# Patient Record
Sex: Male | Born: 1970 | Race: Black or African American | Hispanic: No | Marital: Married | Smoking: Never smoker
Health system: Southern US, Community
[De-identification: ages and names within clinical notes are randomized; demographics above are authoritative.]

## PROBLEM LIST (undated history)

## (undated) DIAGNOSIS — I10 Essential (primary) hypertension: Secondary | ICD-10-CM

## (undated) DIAGNOSIS — Z860100 Personal history of colon polyps, unspecified: Secondary | ICD-10-CM

## (undated) DIAGNOSIS — Z8619 Personal history of other infectious and parasitic diseases: Secondary | ICD-10-CM

## (undated) DIAGNOSIS — E785 Hyperlipidemia, unspecified: Secondary | ICD-10-CM

## (undated) DIAGNOSIS — I2510 Atherosclerotic heart disease of native coronary artery without angina pectoris: Secondary | ICD-10-CM

## (undated) DIAGNOSIS — T7840XA Allergy, unspecified, initial encounter: Secondary | ICD-10-CM

## (undated) HISTORY — PX: UMBILICAL HERNIA REPAIR: SHX196

## (undated) HISTORY — DX: Personal history of other infectious and parasitic diseases: Z86.19

## (undated) HISTORY — DX: Personal history of colonic polyps: Z86.010

## (undated) HISTORY — PX: SHOULDER SURGERY: SHX246

## (undated) HISTORY — PX: OTHER SURGICAL HISTORY: SHX169

## (undated) HISTORY — DX: Allergy, unspecified, initial encounter: T78.40XA

## (undated) HISTORY — DX: Atherosclerotic heart disease of native coronary artery without angina pectoris: I25.10

## (undated) HISTORY — DX: Personal history of colon polyps, unspecified: Z86.0100

## (undated) HISTORY — DX: Essential (primary) hypertension: I10

## (undated) HISTORY — DX: Hyperlipidemia, unspecified: E78.5

---
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
## 2001-09-09 ENCOUNTER — Encounter: Admission: RE | Admit: 2001-09-09 | Discharge: 2001-12-08 | Payer: Self-pay | Admitting: Family Medicine

## 2004-01-18 ENCOUNTER — Encounter: Admission: RE | Admit: 2004-01-18 | Discharge: 2004-01-18 | Payer: Self-pay | Admitting: Family Medicine

## 2004-01-26 ENCOUNTER — Ambulatory Visit (HOSPITAL_BASED_OUTPATIENT_CLINIC_OR_DEPARTMENT_OTHER): Admission: RE | Admit: 2004-01-26 | Discharge: 2004-01-26 | Payer: Self-pay | Admitting: *Deleted

## 2007-06-04 ENCOUNTER — Emergency Department (HOSPITAL_COMMUNITY): Admission: EM | Admit: 2007-06-04 | Discharge: 2007-06-05 | Payer: Self-pay | Admitting: Emergency Medicine

## 2009-05-06 ENCOUNTER — Emergency Department: Payer: Self-pay | Admitting: Emergency Medicine

## 2010-08-11 NOTE — Op Note (Signed)
NAMEPEYSON, POSTEMA               ACCOUNT NO.:  [id number]   MEDICAL RECORD NO.:  [id number]          PATIENT TYPE:  AMB   LOCATION:  [facility]                         FACILITY:  [facility]   PHYSICIAN:  Vikki Ports, MDDATE OF BIRTH:  Jul 16, 1970   DATE OF PROCEDURE:  01/26/2004  DATE OF DISCHARGE:  01/26/2004                                 OPERATIVE REPORT   PREOPERATIVE DIAGNOSIS:  Umbilical hernia.   POSTOPERATIVE DIAGNOSIS:  Umbilical hernia.   PROCEDURE:  Umbilical hernia repair with mesh.   SURGEON:  Vikki Ports, MD   ANESTHESIA:  General.   DESCRIPTION OF PROCEDURE:  Patient was taken to the operating room and  placed in a supine position.  After adequate general anesthesia using an  endotracheal tube, the abdomen was prepped and draped in a normal sterile  fashion.  A transverse supraumbilical incision was made, dissected down to  the small hernia sac, which was excised.  The fascial defect was identified  and measured about 1.5 to 2 cm.  It was closed with an interrupted 0  Surgilene.  A piece of Parietex mesh was placed over it and tacked in place  with the Surgilene.  Adequate hemostasis was insured, and the skin was  closed with subcuticular 4-0 Monocryl.  Steri-Strips and dry sterile  dressings were applied.  The patient tolerated the procedure well and went  to the PACU in good condition.     Gaylyn Rong   KRH/MEDQ  D:  01/28/2004  T:  01/28/2004  Job:  [id number]

## 2013-04-25 ENCOUNTER — Encounter: Payer: Self-pay | Admitting: *Deleted

## 2020-11-02 ENCOUNTER — Other Ambulatory Visit: Payer: Self-pay

## 2020-11-02 ENCOUNTER — Encounter: Payer: Self-pay | Admitting: Family Medicine

## 2020-11-02 ENCOUNTER — Ambulatory Visit (INDEPENDENT_AMBULATORY_CARE_PROVIDER_SITE_OTHER): Payer: 59 | Admitting: Family Medicine

## 2020-11-02 VITALS — BP 118/78 | HR 84 | Temp 97.6°F | Ht 69.0 in | Wt 209.0 lb

## 2020-11-02 DIAGNOSIS — Z6832 Body mass index (BMI) 32.0-32.9, adult: Secondary | ICD-10-CM | POA: Insufficient documentation

## 2020-11-02 DIAGNOSIS — H3500 Unspecified background retinopathy: Secondary | ICD-10-CM | POA: Diagnosis not present

## 2020-11-02 DIAGNOSIS — E7800 Pure hypercholesterolemia, unspecified: Secondary | ICD-10-CM | POA: Diagnosis not present

## 2020-11-02 DIAGNOSIS — I10 Essential (primary) hypertension: Secondary | ICD-10-CM

## 2020-11-02 DIAGNOSIS — Z6830 Body mass index (BMI) 30.0-30.9, adult: Secondary | ICD-10-CM

## 2020-11-02 DIAGNOSIS — R7303 Prediabetes: Secondary | ICD-10-CM | POA: Diagnosis not present

## 2020-11-02 DIAGNOSIS — E6609 Other obesity due to excess calories: Secondary | ICD-10-CM | POA: Insufficient documentation

## 2020-11-02 DIAGNOSIS — N183 Chronic kidney disease, stage 3 unspecified: Secondary | ICD-10-CM | POA: Diagnosis not present

## 2020-11-02 DIAGNOSIS — E785 Hyperlipidemia, unspecified: Secondary | ICD-10-CM | POA: Insufficient documentation

## 2020-11-02 DIAGNOSIS — E1169 Type 2 diabetes mellitus with other specified complication: Secondary | ICD-10-CM | POA: Insufficient documentation

## 2020-11-02 LAB — CBC WITH DIFFERENTIAL/PLATELET
Basophils Absolute: 0 10*3/uL (ref 0.0–0.1)
Basophils Relative: 0.9 % (ref 0.0–3.0)
Eosinophils Absolute: 0.1 10*3/uL (ref 0.0–0.7)
Eosinophils Relative: 1.3 % (ref 0.0–5.0)
HCT: 48.7 % (ref 39.0–52.0)
Hemoglobin: 16.4 g/dL (ref 13.0–17.0)
Lymphocytes Relative: 48.4 % — ABNORMAL HIGH (ref 12.0–46.0)
Lymphs Abs: 2 10*3/uL (ref 0.7–4.0)
MCHC: 33.6 g/dL (ref 30.0–36.0)
MCV: 86.7 fl (ref 78.0–100.0)
Monocytes Absolute: 0.2 10*3/uL (ref 0.1–1.0)
Monocytes Relative: 5.6 % (ref 3.0–12.0)
Neutro Abs: 1.8 10*3/uL (ref 1.4–7.7)
Neutrophils Relative %: 43.8 % (ref 43.0–77.0)
Platelets: 243 10*3/uL (ref 150.0–400.0)
RBC: 5.62 Mil/uL (ref 4.22–5.81)
RDW: 13.8 % (ref 11.5–15.5)
WBC: 4.1 10*3/uL (ref 4.0–10.5)

## 2020-11-02 LAB — COMPREHENSIVE METABOLIC PANEL
ALT: 25 U/L (ref 0–53)
AST: 24 U/L (ref 0–37)
Albumin: 4.7 g/dL (ref 3.5–5.2)
Alkaline Phosphatase: 45 U/L (ref 39–117)
BUN: 15 mg/dL (ref 6–23)
CO2: 28 mEq/L (ref 19–32)
Calcium: 10 mg/dL (ref 8.4–10.5)
Chloride: 102 mEq/L (ref 96–112)
Creatinine, Ser: 1.23 mg/dL (ref 0.40–1.50)
GFR: 68.78 mL/min (ref >60.00)
Glucose, Bld: 105 mg/dL — ABNORMAL HIGH (ref 70–99)
Potassium: 4.2 mEq/L (ref 3.5–5.1)
Sodium: 139 mEq/L (ref 135–145)
Total Bilirubin: 0.9 mg/dL (ref 0.2–1.2)
Total Protein: 7.5 g/dL (ref 6.0–8.3)

## 2020-11-02 LAB — LIPID PANEL
Cholesterol: 209 mg/dL — ABNORMAL HIGH (ref 0–200)
HDL: 43.9 mg/dL (ref >39.00)
NonHDL: 164.69
Total CHOL/HDL Ratio: 5
Triglycerides: 205 mg/dL — ABNORMAL HIGH (ref 0.0–149.0)
VLDL: 41 mg/dL — ABNORMAL HIGH (ref 0.0–40.0)

## 2020-11-02 LAB — LDL CHOLESTEROL, DIRECT: Direct LDL: 141 mg/dL

## 2020-11-02 LAB — HEMOGLOBIN A1C: Hgb A1c MFr Bld: 6 % (ref 4.6–6.5)

## 2020-11-02 LAB — TSH: TSH: 1.73 u[IU]/mL (ref 0.35–5.50)

## 2020-11-02 NOTE — Assessment & Plan Note (Signed)
In setting of HTN and hyperlipidemia and ckd and prediabetes Discussed how this problem influences overall health and the risks it imposes  Reviewed plan for weight loss with lower calorie diet (via better food choices and also portion control or program like weight watchers) and exercise building up to or more than 30 minutes 5 days per week including some aerobic activity   Pt is motivated to return to better habits

## 2020-11-02 NOTE — Assessment & Plan Note (Signed)
Per pt highest a1c in the past was 6.4  Then went down to 5.7 with diet/exercise and wt loss  Since then, in process of move gained wt back and went back to poor eating  Labs ordered today  Expect inc in a1c Discussed return to low glycemic diet and exercise when able to

## 2020-11-02 NOTE — Assessment & Plan Note (Signed)
Recently moved here from [facility] and will need a new nephrologist  Last GFR 47 and Cr 1.69  H/o HTN and hyperlipidemia  Pt thinks a prior medication may have caused problems  Reviewed some records in care everywhere from [facility]  Ref made to local nephrologist  Labs ordered

## 2020-11-02 NOTE — Patient Instructions (Addendum)
Try to get most of your carbohydrates from produce (with the exception of white potatoes)  Eat less bread/pasta/rice/snack foods/cereals/sweets and other items from the middle of the grocery store (processed carbs)  Decide what you want to do for exercise   Labs today   I will place a referral for ophthalmology and nephrology

## 2020-11-02 NOTE — Assessment & Plan Note (Signed)
bp in fair control at this time  In the setting of CKD BP Readings from Last 1 Encounters:  11/02/20 118/78   No changes needed Most recent labs reviewed  Disc lifstyle change with low sodium diet and exercise  Plan to continue losartan 50 mg daily  Making plan to refer to nephrology  Labs ordered today

## 2020-11-02 NOTE — Assessment & Plan Note (Signed)
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

## 2020-11-02 NOTE — Assessment & Plan Note (Signed)
Pt takes crestor 20 mg daily (did not tolerate 40, had muscle pain) Tolerates this well  Diet is fair Enc low sat/trans fat diet if possible  Labs ordered today
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
## 2020-11-06 ENCOUNTER — Telehealth: Payer: Self-pay | Admitting: Family Medicine

## 2020-11-06 DIAGNOSIS — E7800 Pure hypercholesterolemia, unspecified: Secondary | ICD-10-CM

## 2020-11-06 MED ORDER — EZETIMIBE 10 MG PO TABS: 10.0000 mg | ORAL_TABLET | Freq: Every day | ORAL | 3 refills | Status: DC

## 2020-11-06 NOTE — Telephone Encounter (Signed)
-----   Message from Shon Millet, [place] sent at 11/04/2020 12:55 PM EDT ----- Pt notified of lab results and Dr. Royden Purl comments. Pt is okay trying Zetia uses [facility]: [address]

## 2020-11-06 NOTE — Telephone Encounter (Signed)
I sent it  Take the zetia daily along with the crestor  Please check fasting lipids in about 6 weeks

## 2021-02-02 ENCOUNTER — Other Ambulatory Visit (HOSPITAL_BASED_OUTPATIENT_CLINIC_OR_DEPARTMENT_OTHER): Payer: Self-pay | Admitting: Nephrology

## 2021-02-02 ENCOUNTER — Other Ambulatory Visit (HOSPITAL_COMMUNITY): Payer: Self-pay | Admitting: Nephrology

## 2021-02-02 DIAGNOSIS — R8290 Unspecified abnormal findings in urine: Secondary | ICD-10-CM

## 2021-02-02 DIAGNOSIS — I10 Essential (primary) hypertension: Secondary | ICD-10-CM

## 2021-02-02 DIAGNOSIS — N1831 Chronic kidney disease, stage 3a: Secondary | ICD-10-CM

## 2021-02-03 ENCOUNTER — Other Ambulatory Visit: Payer: Self-pay

## 2021-02-03 ENCOUNTER — Ambulatory Visit (INDEPENDENT_AMBULATORY_CARE_PROVIDER_SITE_OTHER): Payer: 59

## 2021-02-03 DIAGNOSIS — Z23 Encounter for immunization: Secondary | ICD-10-CM | POA: Diagnosis not present

## 2021-02-10 ENCOUNTER — Ambulatory Visit: Payer: 59

## 2021-03-09 ENCOUNTER — Other Ambulatory Visit: Payer: Self-pay | Admitting: Nephrology

## 2021-03-09 DIAGNOSIS — N1831 Chronic kidney disease, stage 3a: Secondary | ICD-10-CM

## 2021-03-09 DIAGNOSIS — I10 Essential (primary) hypertension: Secondary | ICD-10-CM

## 2021-03-09 DIAGNOSIS — R8290 Unspecified abnormal findings in urine: Secondary | ICD-10-CM

## 2021-04-11 ENCOUNTER — Ambulatory Visit: Payer: Self-pay | Admitting: Family Medicine

## 2021-04-20 ENCOUNTER — Telehealth: Payer: Self-pay | Admitting: Family Medicine

## 2021-04-20 NOTE — Telephone Encounter (Signed)
Pt called in want's to discuss a bill for a family member that need to be resolve would like a call back #[phone]

## 2021-05-17 ENCOUNTER — Ambulatory Visit (INDEPENDENT_AMBULATORY_CARE_PROVIDER_SITE_OTHER): Payer: 59 | Admitting: Urology

## 2021-05-17 ENCOUNTER — Other Ambulatory Visit: Payer: Self-pay

## 2021-05-17 ENCOUNTER — Encounter: Payer: Self-pay | Admitting: Urology

## 2021-05-17 VITALS — BP 139/93 | HR 80 | Ht 69.0 in | Wt 210.0 lb

## 2021-05-17 DIAGNOSIS — E349 Endocrine disorder, unspecified: Secondary | ICD-10-CM

## 2021-05-17 DIAGNOSIS — N529 Male erectile dysfunction, unspecified: Secondary | ICD-10-CM

## 2021-05-17 DIAGNOSIS — N5082 Scrotal pain: Secondary | ICD-10-CM

## 2021-05-17 LAB — URINALYSIS, COMPLETE
Bilirubin, UA: NEGATIVE
Glucose, UA: NEGATIVE
Ketones, UA: NEGATIVE
Leukocytes,UA: NEGATIVE
Nitrite, UA: NEGATIVE
Protein,UA: NEGATIVE
Specific Gravity, UA: 1.02 (ref 1.005–1.030)
Urobilinogen, Ur: 0.2 mg/dL (ref 0.2–1.0)
pH, UA: 5.5 (ref 5.0–7.5)

## 2021-05-17 LAB — MICROSCOPIC EXAMINATION
Bacteria, UA: NONE SEEN
Epithelial Cells (non renal): NONE SEEN /hpf (ref 0–10)
WBC, UA: NONE SEEN /hpf (ref 0–5)

## 2021-05-17 MED ORDER — TADALAFIL 20 MG PO TABS: ORAL_TABLET | ORAL | 0 refills | Status: DC
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
## 2021-05-18 ENCOUNTER — Encounter: Payer: Self-pay | Admitting: Urology

## 2021-05-18 LAB — TESTOSTERONE: Testosterone: 193 ng/dL — ABNORMAL LOW (ref 264–916)

## 2021-05-18 LAB — LUTEINIZING HORMONE: LH: 3.3 m[IU]/mL (ref 1.7–8.6)

## 2021-05-19 ENCOUNTER — Ambulatory Visit
Admission: RE | Admit: 2021-05-19 | Discharge: 2021-05-19 | Disposition: A | Discharge status: Home / Self Care | Payer: 59 | Source: Ambulatory Visit | Attending: Urology | Admitting: Urology

## 2021-05-19 ENCOUNTER — Telehealth: Payer: Self-pay | Admitting: Urology

## 2021-05-19 ENCOUNTER — Other Ambulatory Visit: Payer: Self-pay

## 2021-05-19 ENCOUNTER — Other Ambulatory Visit: Payer: Self-pay | Admitting: Urology

## 2021-05-19 ENCOUNTER — Encounter: Payer: Self-pay | Admitting: *Deleted

## 2021-05-19 DIAGNOSIS — N5082 Scrotal pain: Secondary | ICD-10-CM | POA: Insufficient documentation

## 2021-05-19 MED ORDER — CLOMIPHENE CITRATE 50 MG PO TABS: 25.0000 mg | ORAL_TABLET | Freq: Every day | ORAL | 0 refills | Status: DC

## 2021-05-19 NOTE — Telephone Encounter (Signed)
Testosterone level low at 193.  LH level was low normal.  Could restart on testosterone however he would need a repeat a.m. testosterone level.  He would also be a good candidate for oral Clomid which will stimulate his testicles to produce more testosterone.  This is an off label use and would not be covered by insurance.  The cost would be ~ $50 per month

## 2021-05-22 ENCOUNTER — Other Ambulatory Visit: Payer: Self-pay | Admitting: *Deleted

## 2021-05-22 ENCOUNTER — Telehealth: Payer: Self-pay

## 2021-05-22 ENCOUNTER — Other Ambulatory Visit: Payer: 59

## 2021-05-22 ENCOUNTER — Other Ambulatory Visit: Payer: Self-pay | Admitting: Nephrology

## 2021-05-22 ENCOUNTER — Other Ambulatory Visit: Payer: Self-pay

## 2021-05-22 DIAGNOSIS — N1831 Chronic kidney disease, stage 3a: Secondary | ICD-10-CM

## 2021-05-22 DIAGNOSIS — E349 Endocrine disorder, unspecified: Secondary | ICD-10-CM

## 2021-05-22 DIAGNOSIS — E785 Hyperlipidemia, unspecified: Secondary | ICD-10-CM

## 2021-05-22 MED ORDER — LOSARTAN POTASSIUM 50 MG PO TABS: 50.0000 mg | ORAL_TABLET | Freq: Every day | ORAL | 1 refills | Status: DC

## 2021-05-23 ENCOUNTER — Encounter: Payer: Self-pay | Admitting: *Deleted

## 2021-05-23 LAB — TESTOSTERONE: Testosterone: 263 ng/dL — ABNORMAL LOW (ref 264–916)

## 2021-05-25 ENCOUNTER — Ambulatory Visit: Payer: Self-pay | Admitting: Urology

## 2021-07-07 ENCOUNTER — Other Ambulatory Visit: Payer: Self-pay

## 2021-07-07 DIAGNOSIS — E349 Endocrine disorder, unspecified: Secondary | ICD-10-CM

## 2021-07-10 ENCOUNTER — Other Ambulatory Visit: Payer: 59

## 2021-07-10 ENCOUNTER — Telehealth: Payer: Self-pay

## 2021-07-10 DIAGNOSIS — E349 Endocrine disorder, unspecified: Secondary | ICD-10-CM

## 2021-07-10 NOTE — Telephone Encounter (Signed)
Pt LM on triage line requesting call back.  ? ?Called pt he states that he has been taking Cialis 20mg  for erections but it is not effective, he questions what his other options are.  ? ?Secondly, pt states that his insurance does cover clomid, it is only a $10 co-pay for him, however he has only noticed minimal improvement of symptoms on clomid and wonders if his dose can be increased pending his labs from today. Please advise.  ?

## 2021-07-11 LAB — TESTOSTERONE: Testosterone: 512 ng/dL (ref 264–916)

## 2021-07-12 NOTE — Telephone Encounter (Signed)
Patient is wanting to know if he can have something stronger than the 20mg  Cialis. He is also wanting to know if he can have Clomid at a higher dose? He said his desire is a little better but not improved to satisfaction.  ?

## 2021-07-13 NOTE — Telephone Encounter (Signed)
Testosterone level was 512 so increasing the Clomid will not improve thanks.  Some patients have good testosterone levels on Clomid but do not see symptom improvement.  His best option would be switching to testosterone injections or gel.  ? ?He can try sildenafil 100 mg for ED.  Can send in 10 tabs for him to try if he has not tried before ?

## 2021-07-14 MED ORDER — SILDENAFIL CITRATE 100 MG PO TABS: 100.0000 mg | ORAL_TABLET | Freq: Every day | ORAL | 0 refills | Status: DC | PRN

## 2021-07-14 NOTE — Telephone Encounter (Signed)
Spoke to patient and he is going to continue on the Clomid. A new rx for Silenafil was sent to the pharmacy. He will try this medication and see if he does better. He will contact our office if he decides to change testosterone therapy.  ?

## 2021-07-17 ENCOUNTER — Other Ambulatory Visit: Payer: Self-pay | Admitting: Family Medicine

## 2021-07-17 MED ORDER — CLOMIPHENE CITRATE 50 MG PO TABS: 25.0000 mg | ORAL_TABLET | Freq: Every day | ORAL | 0 refills | Status: DC

## 2021-07-25 ENCOUNTER — Other Ambulatory Visit: Payer: Self-pay | Admitting: Family Medicine

## 2021-09-17 ENCOUNTER — Other Ambulatory Visit: Payer: Self-pay | Admitting: Urology

## 2021-09-18 ENCOUNTER — Telehealth: Payer: Self-pay | Admitting: Family Medicine

## 2021-09-18 ENCOUNTER — Other Ambulatory Visit: Payer: Self-pay

## 2021-09-18 MED ORDER — LOSARTAN POTASSIUM 50 MG PO TABS: 50.0000 mg | ORAL_TABLET | Freq: Every day | ORAL | 0 refills | Status: DC

## 2021-09-18 NOTE — Telephone Encounter (Signed)
Sent refill

## 2021-09-27 ENCOUNTER — Telehealth: Payer: Self-pay

## 2021-09-27 NOTE — Telephone Encounter (Signed)
[facility] - Client TELEPHONE ADVICE RECORD AccessNurse Patient Name: Bruce Murphy Gender: Male DOB: Jul 12, 1970 Age: 51 Y 8 M 29 D Return Phone Number: [phone] (Primary) Address: City/ State/ Zip: [place] [address] Client [facility] - Client Client Site [facility] - Day Provider [place], Idamae Schuller - MD Contact Type Call Who Is Calling Patient / Member / Family / Caregiver Call Type Triage / Clinical Caller Name Danielle from office Relationship To Patient Other Return Phone Number [phone] (Primary) Chief Complaint CHEST PAIN - pain, pressure, heaviness or tightness Reason for Call Symptomatic / Request for Health Information Initial Comment Caller states pt has random attacks of chest pain and shortness of breath. Translation No Nurse Assessment Nurse: [place], RN, Marchelle Folks Date/Time (Eastern Time): 09/27/2021 9:22:26 AM Confirm and document reason for call. If symptomatic, describe symptoms. ---caller states he has chest pain and shortness of breath several times per day. going on for months. getting worse. Does the patient have any new or worsening symptoms? ---Yes Will a triage be completed? ---Yes Related visit to physician within the last 2 weeks? ---No Does the PT have any chronic conditions? (i.e. diabetes, asthma, this includes High risk factors for pregnancy, etc.) ---Yes List chronic conditions. ---HTN, Is this a behavioral health or substance abuse call? ---No Guidelines Guideline Title Affirmed Question Affirmed Notes Nurse Date/Time Lamount Cohen Time) Chest Pain [1] Chest pain lasts > 5 minutes AND [2] age > 46 Humfleet, RN, Marchelle Folks 09/27/2021 9:23:16 AM Disp. Time Lamount Cohen Time) Disposition Final User 09/27/2021 9:20:35 AM Send to Urgent Queue Lynnell Grain 09/27/2021 9:27:20 AM Call EMS 911 Now Yes Humfleet, RN, Marchelle Folks PLEASE NOTE: All timestamps contained within  this report are represented as [place] Standard Time. CONFIDENTIALTY NOTICE: This fax transmission is intended only for the addressee. It contains information that is legally privileged, confidential or otherwise protected from use or disclosure. If you are not the intended recipient, you are strictly prohibited from reviewing, disclosing, copying using or disseminating any of this information or taking any action in reliance on or regarding this information. If you have received this fax in error, please notify us immediately by telephone so that we can arrange for its return to [place]. Phone: [phone], Toll-Free: [phone], Fax: [phone] Page: 2 of 2 Call Id: [id number] Disp. Time Lamount Cohen Time) Disposition Final User 09/27/2021 9:34:10 AM 911 Outcome Documentation Humfleet, RN, Marchelle Folks Reason: driving to [place] Final Disposition 09/27/2021 9:27:20 AM Call EMS 911 Now Yes Humfleet, RN, Earnestine Leys Disagree/Comply Comply Caller Understands Yes PreDisposition InappropriateToAsk Care Advice Given Per Guideline CARE ADVICE given per Chest Pain (Adult) guideline. CALL EMS 911 NOW: * Immediate medical attention is needed. You need to hang up and call 911 (or an ambulance). Comments User: Jaclynn Major, RN Date/Time Lamount Cohen Time): 09/27/2021 9:28:29 AM patient states he is in the car now said but will call 91

## 2021-09-27 NOTE — Telephone Encounter (Signed)
I spoke with pt and he is presently at [facility] and is about to be triaged and if needed he will go to ED. Sending note to Dr Milinda Antis as Lorain Childes.

## 2021-09-27 NOTE — Telephone Encounter (Signed)
Aware, will watch for correspondence  

## 2021-09-28 ENCOUNTER — Ambulatory Visit (INDEPENDENT_AMBULATORY_CARE_PROVIDER_SITE_OTHER): Payer: No Typology Code available for payment source | Admitting: Nurse Practitioner

## 2021-09-28 VITALS — BP 142/78 | HR 85 | Temp 98.7°F | Resp 16 | Ht 69.0 in | Wt 214.0 lb

## 2021-09-28 DIAGNOSIS — R0789 Other chest pain: Secondary | ICD-10-CM | POA: Insufficient documentation

## 2021-09-28 DIAGNOSIS — R0689 Other abnormalities of breathing: Secondary | ICD-10-CM | POA: Diagnosis not present

## 2021-09-28 NOTE — Progress Notes (Signed)
Established Patient Office Visit  Subjective   Patient ID: Bruce Murphy, male    DOB: May 28, 1970  Age: 51 y.o. MRN: [id number]  Chief Complaint  Patient presents with   Follow-up    X few months gasping for air. Has happened 6 times this am.    HPI  Patient was seen in the emergency department on 09/27/2021.  Looks like he went to an urgent care earlier doctor's office and was told to go to the emergency department.  To be evaluated for chest pain has been present for months.  He was evaluated and discharged they did do basic blood work inclusive of CBC, CMP, troponin, BNP and CTA of chest that were all negative. Patient presents to clinic today for follow-up visit  States he was given levisin in the emergency department that he has not started taking it.  Patient denies history of reflux or current reflux symptoms  States that he has episodes like sleep apnea but during the day. States they do not correlate with anything states is not before nor after he eats.  States is not with any specific activity.  Says it is comparable to kind of with a hiccup would be much more intense.  States he did have an episode yesterday when it happened his chest did bother him for some time afterwards, like a soreness pain.   Review of Systems  Constitutional:  Negative for chills and fever.  Respiratory:  Negative for cough and shortness of breath.   Cardiovascular:  Positive for chest pain (MSK).  Gastrointestinal:  Negative for abdominal pain, heartburn, nausea and vomiting.      Objective:     BP (!) 142/78   Pulse 85   Temp 98.7 F (37.1 C)   Resp 16   Ht 5\' 9"  (1.753 m)   Wt 214 lb (97.1 kg)   SpO2 98%   BMI 31.60 kg/m    Physical Exam Vitals and nursing note reviewed.  Constitutional:      Appearance: Normal appearance.  Cardiovascular:     Rate and Rhythm: Normal rate and regular rhythm.     Heart sounds: Normal heart sounds.  Pulmonary:     Effort: Pulmonary effort is  normal.     Breath sounds: Normal breath sounds.  Abdominal:     General: Bowel sounds are normal. There is no distension.     Palpations: There is no mass.     Tenderness: There is no abdominal tenderness.     Hernia: No hernia is present.  Neurological:     Mental Status: He is alert.      No results found for any visits on 09/28/21.    The 10-year ASCVD risk score (Arnett DK, et al., 2019) is: 11.1%    Assessment & Plan:   Problem List Items Addressed This Visit       Other   Atypical chest pain - Primary    Patient was recently seen in the emergency department with labs performed along with a CT angio chest are all came back negative.  He was prescribed Levsin and has not started yet.  Encourage patient to try Levsin report back if this helps for possible esophageal spasms      Gasping for breath    On presentation per patient report.  He is described as waking gasping for air and likely sleep apnea episode.  Also correlates it to somewhat like a hiccup but much more intense.  We will start with  him trying Levsin to see if this helps for possible esophageal spasm.  If not we can discuss further and maybe trying a muscle relaxant for medications used to help cease hiccups.  Also another option would be maybe starting magnesium to help with some smooth muscle disorder movement in the chest.  Patient will keep [place] updated       Return if symptoms worsen or fail to improve.    Audria Nine, NP

## 2021-09-28 NOTE — Assessment & Plan Note (Signed)
On presentation per patient report.  He is described as waking gasping for air and likely sleep apnea episode.  Also correlates it to somewhat like a hiccup but much more intense.  We will start with him trying Levsin to see if this helps for possible esophageal spasm.  If not we can discuss further and maybe trying a muscle relaxant for medications used to help cease hiccups.  Also another option would be maybe starting magnesium to help with some smooth muscle disorder movement in the chest.  Patient will keep [place] updated

## 2021-09-28 NOTE — Assessment & Plan Note (Signed)
Patient was recently seen in the emergency department with labs performed along with a CT angio chest are all came back negative.  He was prescribed Levsin and has not started yet.  Encourage patient to try Levsin report back if this helps for possible esophageal spasms

## 2021-10-16 ENCOUNTER — Telehealth: Payer: Self-pay | Admitting: Family Medicine

## 2021-10-16 DIAGNOSIS — K2289 Other specified disease of esophagus: Secondary | ICD-10-CM

## 2021-10-16 NOTE — Telephone Encounter (Signed)
Spoke to patient by telephone and was advised that he has taken the Levsin which has helped some but is still having the episodes. Patient stated that he is not having any heartburn or indigestion. Patient stated that he is not taking anything for acid reflux. Patient stated that this has been going on for several months.

## 2021-10-16 NOTE — Telephone Encounter (Signed)
Spoke to patient by telephone and was advised that he was in recently to see Audria Nine NP. Patient stated that he has been having attacks and thinking that it may be esophageal attacks. Patient stated that he has reached out to a GI practice and was advised that they can see him but would need a referral from his PCP.  Patient stated that he is okay for a referral to someone else if Dr. Milinda Antis has a particular group that she refers to.

## 2021-10-16 NOTE — Telephone Encounter (Signed)
Patient and stated that he needs a referral to a GI doctor. He found a facility, [facility], [place] [id number]. Call back number [phone].

## 2021-10-16 NOTE — Telephone Encounter (Signed)
Before I put the referral in please ask if he has tried the levsin he was px and if so did it help?  Is he taking anything for acid reflux ? Does he experience any heartburn or indigestion?  Thanks  Let me know  I can try for the [place] location first

## 2021-10-24 ENCOUNTER — Telehealth: Payer: Self-pay | Admitting: Family Medicine

## 2021-10-24 NOTE — Telephone Encounter (Signed)
Hey  can you  check the status of this , thanks

## 2021-10-24 NOTE — Telephone Encounter (Signed)
Patient called and stated he hasn't heard anything about his referral for GI. Call back number [phone].

## 2021-10-26 NOTE — Telephone Encounter (Signed)
Patient called to follow up on this but I dont see where a referral has been put in yet. Please advise

## 2021-10-30 DIAGNOSIS — K2289 Other specified disease of esophagus: Secondary | ICD-10-CM | POA: Insufficient documentation

## 2021-10-30 NOTE — Telephone Encounter (Signed)
Called informed pt this referral the patient will let  us know.

## 2021-10-30 NOTE — Telephone Encounter (Signed)
The referral is in  Let us know if you don't hear back in 1-2 weeks Alert [place] if symptoms worsen in the meantime

## 2021-11-02 ENCOUNTER — Other Ambulatory Visit: Payer: Self-pay | Admitting: Urology

## 2021-11-08 ENCOUNTER — Encounter: Payer: Self-pay | Admitting: *Deleted

## 2021-11-08 NOTE — Telephone Encounter (Signed)
Hey can you can on this

## 2021-11-08 NOTE — Telephone Encounter (Signed)
Patient called in stating that he reached out to the GI office and they still haven't received an referral. He would like a phone call as to what is going on.

## 2021-11-08 NOTE — Telephone Encounter (Signed)
Sent a message to the referral dept.

## 2021-11-10 ENCOUNTER — Telehealth: Payer: Self-pay | Admitting: Family Medicine

## 2021-11-10 NOTE — Telephone Encounter (Signed)
  Encourage patient to contact the pharmacy for refills or they can request refills through [facility]  Did the patient contact the pharmacy: Yes  LAST APPOINTMENT DATE: 09/28/21  NEXT APPOINTMENT DATE: N/A  MEDICATION: rosuvastatin (CRESTOR) 40 MG tablet  Is the patient out of medication? Yes  PHARMACY: [id number] - [place], [place] - [address] AT [address]  Let patient know to contact pharmacy at the end of the day to make sure medication is ready.  Please notify patient to allow 48-72 hours to process

## 2021-11-13 MED ORDER — ROSUVASTATIN CALCIUM 40 MG PO TABS: 40.0000 mg | ORAL_TABLET | Freq: Every day | ORAL | 0 refills | Status: DC

## 2021-11-13 NOTE — Telephone Encounter (Signed)
I sent one refill  Please schedule appt (annual exam with labs prior if possible)  -he is due  Thanks

## 2021-11-13 NOTE — Telephone Encounter (Signed)
This was filled by another  provider is this okay to refill

## 2021-11-13 NOTE — Addendum Note (Signed)
Addended by: Roxy Manns A on: 11/13/2021 12:32 PM   Modules accepted: Orders

## 2021-11-13 NOTE — Telephone Encounter (Signed)
Called and notified pt and made him an appt.

## 2021-11-16 ENCOUNTER — Encounter: Payer: Self-pay | Admitting: Family Medicine

## 2021-11-16 ENCOUNTER — Ambulatory Visit (INDEPENDENT_AMBULATORY_CARE_PROVIDER_SITE_OTHER): Payer: No Typology Code available for payment source | Admitting: Family Medicine

## 2021-11-16 VITALS — BP 138/80 | HR 93 | Ht 68.0 in | Wt 213.0 lb

## 2021-11-16 DIAGNOSIS — I10 Essential (primary) hypertension: Secondary | ICD-10-CM

## 2021-11-16 DIAGNOSIS — E7800 Pure hypercholesterolemia, unspecified: Secondary | ICD-10-CM

## 2021-11-16 DIAGNOSIS — N183 Chronic kidney disease, stage 3 unspecified: Secondary | ICD-10-CM | POA: Diagnosis not present

## 2021-11-16 DIAGNOSIS — Z6832 Body mass index (BMI) 32.0-32.9, adult: Secondary | ICD-10-CM

## 2021-11-16 DIAGNOSIS — Z0000 Encounter for general adult medical examination without abnormal findings: Secondary | ICD-10-CM

## 2021-11-16 DIAGNOSIS — Z125 Encounter for screening for malignant neoplasm of prostate: Secondary | ICD-10-CM | POA: Diagnosis not present

## 2021-11-16 DIAGNOSIS — R0789 Other chest pain: Secondary | ICD-10-CM

## 2021-11-16 DIAGNOSIS — R7303 Prediabetes: Secondary | ICD-10-CM

## 2021-11-16 LAB — CBC WITH DIFFERENTIAL/PLATELET
Basophils Absolute: 0 10*3/uL (ref 0.0–0.1)
Basophils Relative: 0.6 % (ref 0.0–3.0)
Eosinophils Absolute: 0.1 10*3/uL (ref 0.0–0.7)
Eosinophils Relative: 1.8 % (ref 0.0–5.0)
HCT: 49.1 % (ref 39.0–52.0)
Hemoglobin: 16.9 g/dL (ref 13.0–17.0)
Lymphocytes Relative: 47 % — ABNORMAL HIGH (ref 12.0–46.0)
Lymphs Abs: 1.8 10*3/uL (ref 0.7–4.0)
MCHC: 34.4 g/dL (ref 30.0–36.0)
MCV: 86.3 fl (ref 78.0–100.0)
Monocytes Absolute: 0.2 10*3/uL (ref 0.1–1.0)
Monocytes Relative: 5.2 % (ref 3.0–12.0)
Neutro Abs: 1.7 10*3/uL (ref 1.4–7.7)
Neutrophils Relative %: 45.4 % (ref 43.0–77.0)
Platelets: 221 10*3/uL (ref 150.0–400.0)
RBC: 5.69 Mil/uL (ref 4.22–5.81)
RDW: 14 % (ref 11.5–15.5)
WBC: 3.8 10*3/uL — ABNORMAL LOW (ref 4.0–10.5)

## 2021-11-16 LAB — LIPID PANEL
Cholesterol: 304 mg/dL — ABNORMAL HIGH (ref 0–200)
HDL: 43.8 mg/dL (ref >39.00)
NonHDL: 260.19
Total CHOL/HDL Ratio: 7
Triglycerides: 272 mg/dL — ABNORMAL HIGH (ref 0.0–149.0)
VLDL: 54.4 mg/dL — ABNORMAL HIGH (ref 0.0–40.0)

## 2021-11-16 LAB — COMPREHENSIVE METABOLIC PANEL
ALT: 24 U/L (ref 0–53)
AST: 16 U/L (ref 0–37)
Albumin: 4.7 g/dL (ref 3.5–5.2)
Alkaline Phosphatase: 41 U/L (ref 39–117)
BUN: 15 mg/dL (ref 6–23)
CO2: 28 mEq/L (ref 19–32)
Calcium: 9.7 mg/dL (ref 8.4–10.5)
Chloride: 101 mEq/L (ref 96–112)
Creatinine, Ser: 1.22 mg/dL (ref 0.40–1.50)
GFR: 68.95 mL/min (ref >60.00)
Glucose, Bld: 127 mg/dL — ABNORMAL HIGH (ref 70–99)
Potassium: 4.3 mEq/L (ref 3.5–5.1)
Sodium: 139 mEq/L (ref 135–145)
Total Bilirubin: 0.9 mg/dL (ref 0.2–1.2)
Total Protein: 7.3 g/dL (ref 6.0–8.3)

## 2021-11-16 LAB — PSA: PSA: 0.43 ng/mL (ref 0.10–4.00)

## 2021-11-16 LAB — TSH: TSH: 2.26 u[IU]/mL (ref 0.35–5.50)

## 2021-11-16 LAB — HEMOGLOBIN A1C: Hgb A1c MFr Bld: 6.8 % — ABNORMAL HIGH (ref 4.6–6.5)

## 2021-11-16 LAB — LDL CHOLESTEROL, DIRECT: Direct LDL: 207 mg/dL

## 2021-11-16 NOTE — Assessment & Plan Note (Signed)
Disc goals for lipids and reasons to control them Rev last labs with pt Rev low sat fat diet in detail Labs today Tolerating crestor 20 mg daily now

## 2021-11-16 NOTE — Assessment & Plan Note (Signed)
Reviewed health habits including diet and exercise and skin cancer prevention Reviewed appropriate screening tests for age  Also reviewed health mt list, fam hx and immunization status , as well as social and family history   See HPI Labs ordered  Plans to get [place] his last tetanus shot date Plans flu shot in the fall  Will get shingrix if affordable  Sent for his most recent colonoscopy report  Derm care utd psa ordered
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
## 2021-11-16 NOTE — Assessment & Plan Note (Signed)
bmet ordered  Seeing nephrologist -had visit and ua in July

## 2021-11-16 NOTE — Assessment & Plan Note (Signed)
bp in fair control at this time  BP Readings from Last 1 Encounters:  11/16/21 138/80   No changes needed Most recent labs reviewed  Disc lifstyle change with low sodium diet and exercise  Plan to continue losartan 50 mg daily  Lab ordered

## 2021-11-16 NOTE — Assessment & Plan Note (Signed)
Last a1c 6.0  Ordered a1c today  Diet not as good  disc imp of low glycemic diet and wt loss to prevent DM2

## 2021-11-16 NOTE — Assessment & Plan Note (Signed)
psa added to labs No family history and no problems

## 2021-11-16 NOTE — Patient Instructions (Addendum)
Get your flu shot in the fall /sept  If you are interested in the new shingles vaccine (Shingrix) - call your local pharmacy to check on coverage and availability  If affordable, get on a wait list at your pharmacy to get the vaccine.   Get [place] your colonoscopy date and let us know  (eventually a copy would be great)  You can upload through mychart   Follow up with GI as planned   Take care of yourself   Labs today

## 2021-11-16 NOTE — Assessment & Plan Note (Signed)
Planning f/u with GI  Had ER visit -reassuring work up  ? If esophageal spasm

## 2021-11-16 NOTE — Assessment & Plan Note (Signed)
Discussed how this problem influences overall health and the risks it imposes  Reviewed plan for weight loss with lower calorie diet (via better food choices and also portion control or program like weight watchers) and exercise building up to or more than 30 minutes 5 days per week including some aerobic activity    

## 2021-11-20 ENCOUNTER — Encounter: Payer: Self-pay | Admitting: Family Medicine

## 2021-12-07 NOTE — Telephone Encounter (Signed)
See referral notes.   Pt seen by Laurita Quint in [place] on 12/04/2021.   Pt is having his notes faxed over from their office.

## 2021-12-12 ENCOUNTER — Other Ambulatory Visit: Payer: Self-pay | Admitting: Urology

## 2021-12-15 ENCOUNTER — Encounter: Payer: Self-pay | Admitting: Family Medicine

## 2021-12-15 ENCOUNTER — Ambulatory Visit (INDEPENDENT_AMBULATORY_CARE_PROVIDER_SITE_OTHER): Payer: No Typology Code available for payment source | Admitting: Family Medicine

## 2021-12-15 VITALS — BP 110/84 | HR 104 | Temp 98.1°F | Ht 68.0 in | Wt 214.2 lb

## 2021-12-15 DIAGNOSIS — M5416 Radiculopathy, lumbar region: Secondary | ICD-10-CM

## 2021-12-15 MED ORDER — DICLOFENAC SODIUM 75 MG PO TBEC: 75.0000 mg | DELAYED_RELEASE_TABLET | Freq: Two times a day (BID) | ORAL | 0 refills | Status: DC
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
## 2021-12-15 NOTE — Patient Instructions (Signed)
Start heat on low back.  Start home PT stretching.  Stop OTC Advil.  Start diclofenac 75 mg twice daily x 2 weeks.  Call if not improving or progressive symptoms.

## 2021-12-15 NOTE — Assessment & Plan Note (Signed)
Acute  No indication for x-ray   Start heat on low back.  Start home PT stretching.  Stop OTC Advil.  Start diclofenac 75 mg twice daily x 2 weeks.  Call if not improving or progressive symptoms.

## 2021-12-20 ENCOUNTER — Telehealth: Payer: Self-pay | Admitting: Family Medicine

## 2021-12-20 ENCOUNTER — Other Ambulatory Visit: Payer: Self-pay | Admitting: Family Medicine

## 2021-12-20 ENCOUNTER — Encounter: Payer: Self-pay | Admitting: *Deleted

## 2021-12-20 DIAGNOSIS — R7303 Prediabetes: Secondary | ICD-10-CM

## 2021-12-20 DIAGNOSIS — R0789 Other chest pain: Secondary | ICD-10-CM

## 2021-12-20 DIAGNOSIS — E7800 Pure hypercholesterolemia, unspecified: Secondary | ICD-10-CM

## 2021-12-20 MED ORDER — CYCLOBENZAPRINE HCL 10 MG PO TABS: 10.0000 mg | ORAL_TABLET | Freq: Every evening | ORAL | 0 refills | Status: DC | PRN

## 2021-12-20 MED ORDER — PREDNISONE 20 MG PO TABS: ORAL_TABLET | ORAL | 0 refills | Status: DC

## 2021-12-20 NOTE — Addendum Note (Signed)
Addended by: Eliezer Lofts E on: 12/20/2021 10:43 AM   Modules accepted: Orders

## 2021-12-20 NOTE — Telephone Encounter (Signed)
Bruce Murphy notified by telephone of Dr. Diona Browner and Dr. Marliss Coots messages.   He would prefer [place] for the Cardiology referral.  The EDG has not been scheduled yet because they were wanted him to see the cardiologist first.   [name] is requesting a  work note to return of Friday due to him still being down with his back.  Ok to write work note?

## 2021-12-20 NOTE — Telephone Encounter (Signed)
Work note written and sent to [employer].

## 2021-12-20 NOTE — Telephone Encounter (Signed)
Will allow Dr. Glori Bickers to address heart checkup questions.  Given minimal improvement in back pain with diclofenac 75 mg p.o. twice daily I will change to prednisone taper and I will send in a muscle relaxant at night to his regular pharmacy.

## 2021-12-20 NOTE — Telephone Encounter (Signed)
Patient called in and stated that he seen Dr. Diona Browner last week and she prescribed some pain reliever. He stated that the pain reliever isn't helping much and that she informed him to call back in if it didn't help and she could send in some prednisone and muscle relaxer's. He called out of work today due to pain and may need a doctors note.   He also stated that his GI wants to do an endoscopy but wants to make sure his heart is fine. The GI doctor was wanting to see if Dr. Glori Bickers could do a heart check up before they perform the endoscopy. The GI doctor he see is Dr. Dema Severin at [facility] [phone] and the nurse extension is [phone].

## 2021-12-20 NOTE — Telephone Encounter (Signed)
Will send note to PCP and Dr. Diona Browner

## 2021-12-20 NOTE — Telephone Encounter (Signed)
Yes, okay to write a work note.

## 2021-12-20 NOTE — Addendum Note (Signed)
Addended by: Loura Pardon A on: 12/20/2021 04:48 PM   Modules accepted: Orders

## 2021-12-20 NOTE — Telephone Encounter (Signed)
Referral done

## 2021-12-20 NOTE — Telephone Encounter (Signed)
Thanks for the heads up, I will refer him to cardiology for that- let me know if he prefers [facility] or [facility]  If he already scheduled the EGD when it it?   Thanks

## 2021-12-20 NOTE — Telephone Encounter (Signed)
    cyclobenzaprine (FLEXERIL) 10 MG tablet                 Take 1 tablet (10 mg total) by mouth at bedtime as needed for muscle spasms., Starting Wed 12/20/2021, Normal               predniSONE (DELTASONE) 20 MG tablet                 3 tabs by mouth daily x 3 days, then 2 tabs by mouth daily x 2 days then 1 tab by mouth daily x 2 days, Normal   [id number] - [place], [place] AT [place] [address] [place] [address]  Phone:  [phone]  Fax:  [phone]

## 2021-12-21 DIAGNOSIS — R072 Precordial pain: Secondary | ICD-10-CM | POA: Insufficient documentation
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
## 2021-12-22 ENCOUNTER — Ambulatory Visit: Payer: No Typology Code available for payment source | Attending: Cardiology | Admitting: Cardiology

## 2021-12-22 ENCOUNTER — Encounter: Payer: Self-pay | Admitting: Cardiology

## 2021-12-22 VITALS — BP 120/86 | HR 82 | Ht 69.0 in | Wt 215.0 lb

## 2021-12-22 DIAGNOSIS — E785 Hyperlipidemia, unspecified: Secondary | ICD-10-CM

## 2021-12-22 DIAGNOSIS — R072 Precordial pain: Secondary | ICD-10-CM

## 2021-12-22 LAB — BASIC METABOLIC PANEL
BUN/Creatinine Ratio: 13 (ref 9–20)
BUN: 14 mg/dL (ref 6–24)
CO2: 27 mmol/L (ref 20–29)
Calcium: 10.1 mg/dL (ref 8.7–10.2)
Chloride: 102 mmol/L (ref 96–106)
Creatinine, Ser: 1.11 mg/dL (ref 0.76–1.27)
Glucose: 139 mg/dL — ABNORMAL HIGH (ref 70–99)
Potassium: 4.8 mmol/L (ref 3.5–5.2)
Sodium: 143 mmol/L (ref 134–144)
eGFR: 81 mL/min/{1.73_m2} (ref >59)

## 2021-12-22 MED ORDER — METOPROLOL TARTRATE 100 MG PO TABS: ORAL_TABLET | ORAL | 0 refills | Status: DC

## 2021-12-22 MED ORDER — EZETIMIBE 10 MG PO TABS: 10.0000 mg | ORAL_TABLET | Freq: Every day | ORAL | 3 refills | Status: DC

## 2021-12-22 NOTE — Patient Instructions (Signed)
Medication Instructions:  START Zetia 10 mg daily  TAKE Metoprolol 100 mg once- two hours before your CT when scheduled.   *If you need a refill on your cardiac medications before your next appointment, please call your pharmacy*   Lab Work: BMET today   If you have labs (blood work) drawn today and your tests are completely normal, you will receive your results only by: MyChart Message (if you have MyChart) OR A paper copy in the mail If you have any lab test that is abnormal or we need to change your treatment, we will call you to review the results.   Testing/Procedures: Your physician has requested that you have cardiac CT. Cardiac computed tomography (CT) is a painless test that uses an x-ray machine to take clear, detailed pictures of your heart. For further information please visit [email or web address] Please follow instruction sheet as given.     Follow-Up: At [facility], you and your health needs are our priority.  As part of our continuing mission to provide you with exceptional heart care, we have created designated Provider Care Teams.  These Care Teams include your primary Cardiologist (physician) and Advanced Practice Providers (APPs -  Physician Assistants and Nurse Practitioners) who all work together to provide you with the care you need, when you need it.  We recommend signing up for the patient portal called "MyChart".  Sign up information is provided on this After Visit Summary.  MyChart is used to connect with patients for Virtual Visits (Telemedicine).  Patients are able to view lab/test results, encounter notes, upcoming appointments, etc.  Non-urgent messages can be sent to your provider as well.   To learn more about what you can do with MyChart, go to [email or web address].    Your next appointment:   As needed  The format for your next appointment:   In Person  Provider:   Rollene Rotunda, MD  Other Instructions  Referral to Dr.Hilty (LIPID  CLINIC)- they will contact you for an appointment.  Look up the movie on [employer]- [employer]     Your cardiac CT will be scheduled at one of the below locations:   [facility] [address] [place], [place] [address] [phone]   If scheduled at [facility], please arrive at the [facility] (Entrance C2) of [facility] 30 minutes prior to test start time. You can use the FREE valet parking offered at entrance C (encouraged to control the heart rate for the test)  Proceed to the [facility] Radiology Department (first floor) to check-in and test prep.  All radiology patients and guests should use entrance C2 at [facility], accessed from [facility], even though the hospital's physical address listed is [address]      Please follow these instructions carefully (unless otherwise directed):  Hold all erectile dysfunction medications at least 3 days (72 hrs) prior to test. (Ie viagra, cialis, sildenafil, tadalafil, etc) We will administer nitroglycerin during this exam.   On the Night Before the Test: Be sure to Drink plenty of water. Do not consume any caffeinated/decaffeinated beverages or chocolate 12 hours prior to your test. Do not take any antihistamines 12 hours prior to your test.  On the Day of the Test: Drink plenty of water until 1 hour prior to the test. Do not eat any food 1 hour prior to test. You may take your regular medications prior to the test.  Take metoprolol (  Lopressor) two hours prior to test. HOLD Furosemide/Hydrochlorothiazide morning of the test.      After the Test: Drink plenty of water. After receiving IV contrast, you may experience a mild flushed feeling. This is normal. On occasion, you may experience a mild rash up to 24 hours after the test. This is not dangerous. If this occurs, you can take Benadryl 25 mg and increase your fluid intake. If you experience  trouble breathing, this can be serious. If it is severe call 911 IMMEDIATELY. If it is mild, please call our office. If you take any of these medications: Glipizide/Metformin, Avandament, Glucavance, please do not take 48 hours after completing test unless otherwise instructed.  We will call to schedule your test 2-4 weeks out understanding that some insurance companies will need an authorization prior to the service being performed.   For non-scheduling related questions, please contact the cardiac imaging nurse navigator should you have any questions/concerns: Marchia Bond, Cardiac Imaging Nurse Navigator Gordy Clement, Cardiac Imaging Nurse Navigator [facility] Direct Office Dial: [phone]   For scheduling needs, including cancellations and rescheduling, please call [place], [phone].

## 2021-12-29 ENCOUNTER — Ambulatory Visit (INDEPENDENT_AMBULATORY_CARE_PROVIDER_SITE_OTHER): Payer: No Typology Code available for payment source | Admitting: Family Medicine

## 2021-12-29 ENCOUNTER — Encounter: Payer: Self-pay | Admitting: Family Medicine

## 2021-12-29 VITALS — BP 144/94 | HR 105 | Temp 97.1°F | Ht 69.0 in | Wt 214.4 lb

## 2021-12-29 DIAGNOSIS — R059 Cough, unspecified: Secondary | ICD-10-CM | POA: Diagnosis not present

## 2021-12-29 DIAGNOSIS — J069 Acute upper respiratory infection, unspecified: Secondary | ICD-10-CM | POA: Insufficient documentation

## 2021-12-29 LAB — POC INFLUENZA A&B (BINAX/QUICKVUE)
Influenza A, POC: NEGATIVE
Influenza B, POC: NEGATIVE

## 2021-12-29 LAB — POCT INFLUENZA A/B
Influenza A, POC: NEGATIVE
Influenza B, POC: NEGATIVE

## 2021-12-29 LAB — POC COVID19 BINAXNOW: SARS Coronavirus 2 Ag: NEGATIVE

## 2021-12-29 MED ORDER — PROMETHAZINE-DM 6.25-15 MG/5ML PO SYRP: 5.0000 mL | ORAL_SOLUTION | Freq: Four times a day (QID) | ORAL | 0 refills | Status: DC | PRN

## 2021-12-29 MED ORDER — BENZONATATE 200 MG PO CAPS: 200.0000 mg | ORAL_CAPSULE | Freq: Three times a day (TID) | ORAL | 1 refills | Status: DC | PRN

## 2021-12-29 NOTE — Patient Instructions (Signed)
I think you have a respiratory virus   Tylenol or ibuprofen for fever  Rest  Fluids  Try the prometh DM and tessalon for cough  If cough gets very productive take an expectorant like mucinex plain)  If runny nose - antihistamine is ok   If not starting to improve in 2-3 days or if worse - get a covid test at the drugstore and do it again   Wear a mask
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
## 2021-12-29 NOTE — Assessment & Plan Note (Signed)
With suspected temp elevation, fatigue  Cough is non prod Reassuring exam Rapid covid and flu tests negative Disc sympt care Disc ER precautions Px prometh dm and tessalon for prn  Rest/fluids  Update if not starting to improve in a week or if worsening

## 2022-01-03 ENCOUNTER — Telehealth: Payer: Self-pay | Admitting: Family Medicine

## 2022-01-03 NOTE — Telephone Encounter (Signed)
Patient called in and stated that he was seen by Dr. Glori Bickers last week and he received a doctors note. He stated he need a new note stating that he was out of work Monday and Tuesday this week. Please advise. Thank you!

## 2022-01-03 NOTE — Telephone Encounter (Signed)
Dr. Glori Bickers if you write the note I can print it and use your signature stamp. Please advise

## 2022-01-03 NOTE — Telephone Encounter (Signed)
The letter is in  Let me know if that does not look right

## 2022-01-03 NOTE — Telephone Encounter (Signed)
Pt notified letter ready he can view it on mychart and didn't need a paper copy

## 2022-01-03 NOTE — Telephone Encounter (Signed)
Left VM requesting pt to call the office back 

## 2022-01-08 ENCOUNTER — Ambulatory Visit (HOSPITAL_COMMUNITY)
Admission: RE | Admit: 2022-01-08 | Discharge: 2022-01-08 | Disposition: A | Discharge status: Home / Self Care | Payer: BC Managed Care – PPO | Source: Ambulatory Visit | Attending: Cardiology | Admitting: Cardiology

## 2022-01-08 ENCOUNTER — Ambulatory Visit (HOSPITAL_COMMUNITY): Admission: RE | Admit: 2022-01-08 | Payer: No Typology Code available for payment source | Source: Ambulatory Visit

## 2022-01-08 DIAGNOSIS — R072 Precordial pain: Secondary | ICD-10-CM | POA: Insufficient documentation

## 2022-01-08 MED ORDER — IOHEXOL 350 MG/ML SOLN
95.0000 mL | Freq: Once | INTRAVENOUS | Status: AC | PRN
  Administered 2022-01-08: 95 mL via INTRAVENOUS

## 2022-01-08 MED ORDER — NITROGLYCERIN 0.4 MG SL SUBL
0.8000 mg | SUBLINGUAL_TABLET | Freq: Once | SUBLINGUAL | Status: AC
  Administered 2022-01-08: 0.8 mg via SUBLINGUAL

## 2022-01-08 MED ORDER — NITROGLYCERIN 0.4 MG SL SUBL
SUBLINGUAL_TABLET | SUBLINGUAL | Status: AC
  Filled 2022-01-08: qty 2

## 2022-01-15 ENCOUNTER — Telehealth: Payer: Self-pay | Admitting: Cardiology

## 2022-01-15 NOTE — Telephone Encounter (Signed)
Pt is requesting his recent labs and any tests results be faxed to the follow clinic:    [facility]  Fax: [phone] Dr. Dema Severin

## 2022-01-15 NOTE — Telephone Encounter (Signed)
Patient stated he needs to have a colonoscopy and wanted recent lab work, CCTA results and EKG sent to his GI physician. Explained to patient the procedure for a surgical/procedure clearance. He stated he would call GI office to let them know. Patient stated the colonoscopy has not been scheduled yet.

## 2022-01-23 ENCOUNTER — Ambulatory Visit: Payer: No Typology Code available for payment source

## 2022-01-23 NOTE — Progress Notes (Deleted)
   01/23/2022 Bruce Murphy 12/08/1970 [id number]   HPI:  Bruce Murphy is a 51 y.o. male patient of Dr Percival Spanish, who presents today for a lipid clinic evaluation.  See pertinent past medical history below.  Based on his most recent LDL reading of 207, he has familial hyperlipidemia.    Past Medical History: hypertension   Pre-diabetes   CKD           Insurance Carrier:  [employer] Employees     Current Medications:  rosuvastatin 40 mg qd, ezetimibe 10 mg qd      Cholesterol Goals: LDL < 100   Intolerant/previously tried:         Family history:   Diet:   Exercise:    Lipid Panel     Component Value Date/Time   CHOL 304 (H) 11/16/2021 1107   TRIG 272.0 (H) 11/16/2021 1107   HDL 43.80 11/16/2021 1107   CHOLHDL 7 11/16/2021 1107   VLDL 54.4 (H) 11/16/2021 1107   LDLDIRECT 207.0 11/16/2021 1107     Current Outpatient Medications  Medication Sig Dispense Refill   benzonatate (TESSALON) 200 MG capsule Take 1 capsule (200 mg total) by mouth 3 (three) times daily as needed for cough. Do not bite pill 30 capsule 1   Cholecalciferol 125 MCG (5000 UT) TABS Take by mouth.     clobetasol (TEMOVATE) 0.05 % external solution SMARTSIG:sparingly Topical Twice Daily PRN     ezetimibe (ZETIA) 10 MG tablet Take 1 tablet (10 mg total) by mouth daily. 90 tablet 3   ketoconazole (NIZORAL) 2 % cream SMARTSIG:sparingly Topical Twice Daily PRN     ketoconazole (NIZORAL) 2 % shampoo Apply topically.     losartan (COZAAR) 50 MG tablet Take 1 tablet by mouth once daily 90 tablet 1   metoprolol tartrate (LOPRESSOR) 100 MG tablet Take 1 tablet by mouth once for procedure. (Patient not taking: Reported on 12/29/2021) 1 tablet 0   promethazine-dextromethorphan (PROMETHAZINE-DM) 6.25-15 MG/5ML syrup Take 5 mLs by mouth 4 (four) times daily as needed for cough. Caution of sedation 120 mL 0   psyllium (METAMUCIL) 58.6 % packet Take by mouth.     rosuvastatin (CRESTOR) 40 MG tablet Take 1  tablet (40 mg total) by mouth at bedtime. 90 tablet 0   tretinoin (RETIN-A) 0.05 % cream SMARTSIG:sparingly Topical Every Night PRN     No current facility-administered medications for this visit.    Allergies  Allergen Reactions   Hydralazine     Dizzy    Penicillins Rash    Past Medical History:  Diagnosis Date   Allergy    History of chicken pox    History of colon polyps    Hyperlipidemia    Hypertension     There were no vitals taken for this visit.   No problem-specific Assessment & Plan notes found for this encounter.   Tommy Medal PharmD CPP [place] [address] [place], [place] [address] [phone]

## 2022-01-31 ENCOUNTER — Other Ambulatory Visit: Payer: Self-pay | Admitting: Urology

## 2022-02-19 ENCOUNTER — Ambulatory Visit: Payer: No Typology Code available for payment source | Admitting: Family Medicine

## 2022-02-25 NOTE — Progress Notes (Deleted)
Patient ID: Bruce Murphy                 DOB: 1970-09-26                    MRN: [id number]      HPI: Bruce Murphy is a 51 y.o. male patient referred to lipid clinic by Dr.Hochrein. PMH is significant for hypertension, CKD, HDL, obesity, prediabetes.  Address compliance   Current Medications: Rosuvastatin 40 mg ezetimibe 10 mg  Intolerances:  Risk Factors: BMI >31, CKD stage III, hypertension, family hx of HDL, hypertension, heart disease, stroke,  LDL goal: <100 mg/dl  Diet:   Exercise:   Family History: Cancer in his maternal grandfather, maternal grandmother, and paternal grandfather; Depression in his sister; Diabetes in his father; Heart disease (age of onset: 50) in his father; Hyperlipidemia in his father and mother; Hypertension in his father and mother; Mental illness in his sister; Stroke in his mother.     Social History:   Labs: Lipid Panel     Component Value Date/Time   CHOL 304 (H) 11/16/2021 1107   TRIG 272.0 (H) 11/16/2021 1107   HDL 43.80 11/16/2021 1107   CHOLHDL 7 11/16/2021 1107   VLDL 54.4 (H) 11/16/2021 1107   LDLDIRECT 207.0 11/16/2021 1107    Past Medical History:  Diagnosis Date   Allergy    History of chicken pox    History of colon polyps    Hyperlipidemia    Hypertension     Current Outpatient Medications on File Prior to Visit  Medication Sig Dispense Refill   benzonatate (TESSALON) 200 MG capsule Take 1 capsule (200 mg total) by mouth 3 (three) times daily as needed for cough. Do not bite pill 30 capsule 1   Cholecalciferol 125 MCG (5000 UT) TABS Take by mouth.     clobetasol (TEMOVATE) 0.05 % external solution SMARTSIG:sparingly Topical Twice Daily PRN     ezetimibe (ZETIA) 10 MG tablet Take 1 tablet (10 mg total) by mouth daily. 90 tablet 3   ketoconazole (NIZORAL) 2 % cream SMARTSIG:sparingly Topical Twice Daily PRN     ketoconazole (NIZORAL) 2 % shampoo Apply topically.     losartan (COZAAR) 50 MG tablet Take 1 tablet by mouth  once daily 90 tablet 1   metoprolol tartrate (LOPRESSOR) 100 MG tablet Take 1 tablet by mouth once for procedure. (Patient not taking: Reported on 12/29/2021) 1 tablet 0   promethazine-dextromethorphan (PROMETHAZINE-DM) 6.25-15 MG/5ML syrup Take 5 mLs by mouth 4 (four) times daily as needed for cough. Caution of sedation 120 mL 0   psyllium (METAMUCIL) 58.6 % packet Take by mouth.     rosuvastatin (CRESTOR) 40 MG tablet Take 1 tablet (40 mg total) by mouth at bedtime. 90 tablet 0   sildenafil (VIAGRA) 100 MG tablet TAKE 1 TABLET BY MOUTH ONCE DAILY AS NEEDED FOR ERECTILE DYSFUNCTION 10 tablet 0   tretinoin (RETIN-A) 0.05 % cream SMARTSIG:sparingly Topical Every Night PRN     No current facility-administered medications on file prior to visit.    Allergies  Allergen Reactions   Hydralazine     Dizzy    Penicillins Rash    Assessment/Plan:  1. Hyperlipidemia -  No problem-specific Assessment & Plan notes found for this encounter.  @MTPCOMPLETEDLIST @  {f/u with PharmD:[id number]}  Thank you,  , Pharm.D [facility] [address], [place], [place]  Phone: [phone]; Fax: [phone]     

## 2022-02-26 ENCOUNTER — Ambulatory Visit: Payer: No Typology Code available for payment source

## 2022-02-26 ENCOUNTER — Other Ambulatory Visit: Payer: Self-pay | Admitting: Family Medicine

## 2022-02-28 ENCOUNTER — Encounter: Payer: Self-pay | Admitting: Family Medicine

## 2022-02-28 ENCOUNTER — Ambulatory Visit: Payer: BC Managed Care – PPO | Admitting: Family Medicine

## 2022-02-28 VITALS — BP 118/74 | HR 91 | Temp 98.1°F | Ht 69.0 in | Wt 213.0 lb

## 2022-02-28 DIAGNOSIS — R7303 Prediabetes: Secondary | ICD-10-CM

## 2022-02-28 DIAGNOSIS — M5416 Radiculopathy, lumbar region: Secondary | ICD-10-CM

## 2022-02-28 DIAGNOSIS — E119 Type 2 diabetes mellitus without complications: Secondary | ICD-10-CM | POA: Diagnosis not present

## 2022-02-28 DIAGNOSIS — N183 Chronic kidney disease, stage 3 unspecified: Secondary | ICD-10-CM

## 2022-02-28 DIAGNOSIS — I10 Essential (primary) hypertension: Secondary | ICD-10-CM | POA: Diagnosis not present

## 2022-02-28 LAB — POCT GLYCOSYLATED HEMOGLOBIN (HGB A1C): Hemoglobin A1C: 6.9 % — AB (ref 4.0–5.6)

## 2022-02-28 LAB — BASIC METABOLIC PANEL
BUN: 18 mg/dL (ref 6–23)
CO2: 32 mEq/L (ref 19–32)
Calcium: 9.9 mg/dL (ref 8.4–10.5)
Chloride: 102 mEq/L (ref 96–112)
Creatinine, Ser: 1.2 mg/dL (ref 0.40–1.50)
GFR: 70.19 mL/min (ref >60.00)
Glucose, Bld: 194 mg/dL — ABNORMAL HIGH (ref 70–99)
Potassium: 4.7 mEq/L (ref 3.5–5.1)
Sodium: 140 mEq/L (ref 135–145)

## 2022-02-28 MED ORDER — METFORMIN HCL ER 500 MG PO TB24: 500.0000 mg | ORAL_TABLET | Freq: Every day | ORAL | 3 refills | Status: DC

## 2022-02-28 NOTE — Assessment & Plan Note (Signed)
bp in fair control at this time  BP Readings from Last 1 Encounters:  02/28/22 118/74   No changes needed Most recent labs reviewed  Disc lifstyle change with low sodium diet and exercise  Plan to continue losartan 50 mg daily  No longer taking metoprolol and still well controlled

## 2022-02-28 NOTE — Assessment & Plan Note (Signed)
Renal panel today  GFR is 70.1  Reassuring  Will plan to try low dose metformin xr for new dm but monitor closely  Enc fluid intake

## 2022-02-28 NOTE — Assessment & Plan Note (Addendum)
New diagnosis today  Lab Results  Component Value Date   HGBA1C 6.9 (A) 02/28/2022   Will plan to ref to DM ed Disc low glycemic diet and exercise goals  Since GFR is over 60, will tx with low dose metformin xr 500 mg  F/u 3 mo  On arb and statin  Will disc this in more detail at f/u and get urine microalb

## 2022-02-28 NOTE — Patient Instructions (Addendum)
I will place a referral for PT and also refer to ortho / the spine center    Try and eat a low glycemic diet  Try to get most of your carbohydrates from produce (with the exception of white potatoes)  Eat less bread/pasta/rice/snack foods/cereals/sweets and other items from the middle of the grocery store (processed carbs)  I will place a referral to diabetic teaching at [place]   Aim for 30 minutes of exercise per day   If you don't get a call about the referrals in 1-2 weeks let us know
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
## 2022-03-01 ENCOUNTER — Encounter: Payer: Self-pay | Admitting: *Deleted

## 2022-03-06 ENCOUNTER — Ambulatory Visit: Payer: BC Managed Care – PPO

## 2022-03-07 ENCOUNTER — Other Ambulatory Visit: Payer: Self-pay | Admitting: Student in an Organized Health Care Education/Training Program

## 2022-03-07 DIAGNOSIS — M5417 Radiculopathy, lumbosacral region: Secondary | ICD-10-CM

## 2022-03-11 ENCOUNTER — Emergency Department
Admission: EM | Admit: 2022-03-11 | Discharge: 2022-03-11 | Disposition: A | Discharge status: Home / Self Care | Payer: BC Managed Care – PPO | Attending: Emergency Medicine | Admitting: Emergency Medicine

## 2022-03-11 ENCOUNTER — Other Ambulatory Visit: Payer: Self-pay

## 2022-03-11 DIAGNOSIS — M5442 Lumbago with sciatica, left side: Secondary | ICD-10-CM | POA: Diagnosis not present

## 2022-03-11 DIAGNOSIS — M545 Low back pain, unspecified: Secondary | ICD-10-CM | POA: Diagnosis present

## 2022-03-11 LAB — CBG MONITORING, ED: Glucose-Capillary: 130 mg/dL — ABNORMAL HIGH (ref 70–99)

## 2022-03-11 MED ORDER — PREDNISONE 10 MG PO TABS: ORAL_TABLET | ORAL | 0 refills | Status: DC

## 2022-03-11 MED ORDER — METHOCARBAMOL 500 MG PO TABS: ORAL_TABLET | ORAL | 0 refills | Status: DC

## 2022-03-11 MED ORDER — OXYCODONE HCL 5 MG PO TABS: 5.0000 mg | ORAL_TABLET | Freq: Four times a day (QID) | ORAL | 0 refills | Status: DC | PRN

## 2022-03-11 MED ORDER — METHOCARBAMOL 500 MG PO TABS
500.0000 mg | ORAL_TABLET | Freq: Once | ORAL | Status: AC
  Administered 2022-03-11: 500 mg via ORAL
  Filled 2022-03-11: qty 1

## 2022-03-11 MED ORDER — OXYCODONE HCL 5 MG PO TABS
5.0000 mg | ORAL_TABLET | Freq: Once | ORAL | Status: AC
  Administered 2022-03-11: 5 mg via ORAL
  Filled 2022-03-11: qty 1
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
## 2022-03-11 NOTE — Discharge Instructions (Addendum)
Call your spine doctor in [place] tomorrow. Medications were sent to the pharmacy for you to begin taking.  The methocarbamol you can take 1 or 2 every 6 hours as needed for muscle spasms.  This medication could cause drowsiness but you are able to take it in the emergency room with 1 tablet.  The oxycodone is for pain and can be taken every 6 hours.  Be aware that the 2 medications together increases your risk for drowsiness and risk for falling.  The prednisone taper begins with 6 tablets today and tapers down by 1 tablet each day until finished.

## 2022-03-11 NOTE — ED Triage Notes (Signed)
Pt states 2 years ago he had back surgery on his disk and now he is having severe back pain for the last 1.5weeks. Pt states this past Thursday he went to the spine doctor and gave him a shot of tordal that helped the pain for 12 hours. Pt states he is supposed to get an MRI, but the pain is really bad so he came to the ED. Pt states laying down he has relief.

## 2022-03-11 NOTE — ED Provider Notes (Signed)
[facility] Provider Note    Event Date/Time   First MD Initiated Contact with Patient 03/11/22 1203     (approximate)   History   Back Pain   HPI  Bruce Murphy is a 51 y.o. male   presents to the ED with complaint of low back pain left-sided with radiculopathy down his left leg.  Patient has been seen by his PCP as [facility] and also by Dr. Allena Katz in [place] at the spine center.  He was given a shot of Toradol recently which helped with his symptoms for approximately 12 hours.  Dr. Allena Katz has ordered an MRI to be done but patient does not have a scheduled date at this time.  Patient states he was not prescribed any medication other than the injection of Toradol and today is having increased pain.  He denies any incontinence of bowel or bladder.      Physical Exam   Triage Vital Signs: ED Triage Vitals  Enc Vitals Group     BP 03/11/22 1049 (!) 148/109     Pulse Rate 03/11/22 1049 (!) 105     Resp 03/11/22 1049 (!) 22     Temp 03/11/22 1049 98.4 F (36.9 C)     Temp src --      SpO2 03/11/22 1049 98 %     Weight 03/11/22 1050 210 lb (95.3 kg)     Height 03/11/22 1050 5\' 9"  (1.753 m)     Head Circumference --      Peak Flow --      Pain Score 03/11/22 1049 10     Pain Loc --      Pain Edu? --      Excl. in GC? --     Most recent vital signs: Vitals:   03/11/22 1049 03/11/22 1423  BP: (!) 148/109 (!) 140/96  Pulse: (!) 105 66  Resp: (!) 22 18  Temp: 98.4 F (36.9 C)   SpO2: 98% 99%     General: Awake, no distress.  Lying on stretcher supine. CV:  Good peripheral perfusion.  Resp:  Normal effort.  Lungs are clear bilaterally. Abd:  No distention.  Soft, nontender. Other:  Left lumbar paravertebral muscles are tender to palpation.  Patient is lying supine at the time of exam.  Straight leg raises are approximately 40 degrees bilaterally.  Left leg elevation causes pain in the lower back.  Patient was slow to get to a sitting  position from supine but no step-offs or point tenderness noted on palpation of the vertebral processes.  Good muscle strength at 5/5 bilaterally in lower extremities.  Patient was able to ambulate to the bathroom while in the emergency department slowly.   ED Results / Procedures / Treatments   Labs (all labs ordered are listed, but only abnormal results are displayed) Labs Reviewed  CBG MONITORING, ED - Abnormal; Notable for the following components:      Result Value   Glucose-Capillary 130 (*)    All other components within normal limits     PROCEDURES:  Critical Care performed:   Procedures   MEDICATIONS ORDERED IN ED: Medications  oxyCODONE (Oxy IR/ROXICODONE) immediate release tablet 5 mg (5 mg Oral Given 03/11/22 1259)  methocarbamol (ROBAXIN) tablet 500 mg (500 mg Oral Given 03/11/22 1259)     IMPRESSION / MDM / ASSESSMENT AND PLAN / ED COURSE  I reviewed the triage vital signs and the nursing notes.   Differential diagnosis includes,  but is not limited to, low back pain with left leg radiculopathy/sciatica, degenerative disc disease, active muscle spasms.  No symptoms to suggest cauda equina.  51 year old male presents to the ED with complaint of low back pain and is currently being seen by the spine center in [facility] and Dr. Allena Katz who has ordered an MRI of his lumbar spine.  Patient was given Toradol while being seen last week and had 12 hours of pain relief.  Patient has no symptoms to suggest cauda equina at this time.  Good muscle strength at 5/5.  Patient is ambulatory with minimal assistance.  Patient was given methocarbamol 500 mg p.o., oxycodone IR 5 mg while in the emergency department.  Patient did get some relief from medication and we discussed continuing with medication until he is able to talk with his doctor in [place] to see when his MRI is been scheduled.  A taper dose of prednisone starting at 60 mg and tapering down by 10 mg each day over the next  6 days, oxycodone IR 5 mg every 6 hours as needed for severe pain and methocarbamol 1 or 2 tablets every 6 hours as needed for muscle spasms.  Patient is aware that while he is taking the muscle relaxant and the pain medication he is not to drive or operate machinery as it could cause drowsiness and increase his risk for injury.  I encouraged him to use ice or heat to his back as needed for discomfort.  Patient is agreeable at this time with trying these medications and he will call his doctor on Monday.      Patient's presentation is most consistent with acute complicated illness / injury requiring diagnostic workup.  FINAL CLINICAL IMPRESSION(S) / ED DIAGNOSES   Final diagnoses:  Acute left-sided low back pain with left-sided sciatica     Rx / DC Orders   ED Discharge Orders          Ordered    oxyCODONE (OXY IR/ROXICODONE) 5 MG immediate release tablet  Every 6 hours PRN        03/11/22 1412    methocarbamol (ROBAXIN) 500 MG tablet        03/11/22 1412    predniSONE (DELTASONE) 10 MG tablet        03/11/22 1412             Note:  This document was prepared using Dragon voice recognition software and may include unintentional dictation errors.   Tommi Rumps, PA-C 03/11/22 1520    Merwyn Katos, MD 03/11/22 [phone]

## 2022-03-13 ENCOUNTER — Ambulatory Visit
Admission: RE | Admit: 2022-03-13 | Discharge: 2022-03-13 | Disposition: A | Discharge status: Home / Self Care | Payer: BC Managed Care – PPO | Source: Ambulatory Visit | Attending: Student in an Organized Health Care Education/Training Program | Admitting: Student in an Organized Health Care Education/Training Program

## 2022-03-13 DIAGNOSIS — M5417 Radiculopathy, lumbosacral region: Secondary | ICD-10-CM

## 2022-03-22 ENCOUNTER — Telehealth: Payer: Self-pay | Admitting: Family Medicine

## 2022-03-22 NOTE — Telephone Encounter (Signed)
Incoming call from patient. Sig back and leg pain with prev referral to spine clinic.  He reportedly is in the midst of getting treatment set up through spine clinic.  He was asking about options.  We agreed that the best option was for me to call the spine clinic on his behalf to see what they can offer in the near future.  I called and left a message at the spine clinic, asking for their help.  I got subsequent report back from patient that he was contacted by the spine clinic.  I thank all involved.  Routed to PCP as FYI.

## 2022-03-22 NOTE — Telephone Encounter (Signed)
Thanks so much for doing that

## 2022-04-03 ENCOUNTER — Telehealth: Payer: Self-pay | Admitting: *Deleted

## 2022-04-03 NOTE — Telephone Encounter (Signed)
Per Dr. Glori Bickers pt needs surgical clearance. Received forms from [facility] specialist

## 2022-04-03 NOTE — Telephone Encounter (Signed)
Clearance forms for Sp[facility] and Sc[facility] specialist shredded please seen pt's question

## 2022-04-03 NOTE — Telephone Encounter (Signed)
I can do clearance before the paperwork comes in - no problem , whatever is convenient for him

## 2022-04-03 NOTE — Telephone Encounter (Signed)
Patient has been scheduled

## 2022-04-03 NOTE — Telephone Encounter (Signed)
Spoke to pt, he stated he got a second opinion from [facility] he will be choosing them for his surgery. Pt asked should he still schedule ov or should he wait for that office to send those forms over before scheduling? Call back # [phone]

## 2022-04-05 ENCOUNTER — Ambulatory Visit: Payer: No Typology Code available for payment source | Admitting: *Deleted

## 2022-04-06 ENCOUNTER — Encounter: Payer: Self-pay | Admitting: Family Medicine

## 2022-04-06 ENCOUNTER — Ambulatory Visit: Payer: BC Managed Care – PPO | Admitting: Family Medicine

## 2022-04-06 VITALS — BP 132/86 | HR 93 | Temp 97.5°F | Ht 69.0 in | Wt 210.0 lb

## 2022-04-06 DIAGNOSIS — Z01818 Encounter for other preprocedural examination: Secondary | ICD-10-CM

## 2022-04-06 DIAGNOSIS — M5416 Radiculopathy, lumbar region: Secondary | ICD-10-CM

## 2022-04-06 DIAGNOSIS — E119 Type 2 diabetes mellitus without complications: Secondary | ICD-10-CM

## 2022-04-06 DIAGNOSIS — I10 Essential (primary) hypertension: Secondary | ICD-10-CM | POA: Diagnosis not present

## 2022-04-06 DIAGNOSIS — E7800 Pure hypercholesterolemia, unspecified: Secondary | ICD-10-CM
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
## 2022-04-06 NOTE — Patient Instructions (Addendum)
Hold your anti inflammatory a week before your surgery unless otherwise instructed   Take care of yourself Stick to a low glycemic diet   No restrictions for surgery

## 2022-04-08 DIAGNOSIS — Z01818 Encounter for other preprocedural examination: Secondary | ICD-10-CM | POA: Insufficient documentation

## 2022-04-08 NOTE — Assessment & Plan Note (Signed)
New diagnosis today  Lab Results  Component Value Date   HGBA1C 6.9 (A) 02/28/2022   Fair control with metformin XR 500 mg daily and has lost wt  Tolerates this well  Last GFR reassuring  Taking statin and arb

## 2022-04-08 NOTE — Assessment & Plan Note (Signed)
bp in fair control at this time  BP Readings from Last 1 Encounters:  04/06/22 132/86   No changes needed Most recent labs reviewed  Disc lifstyle change with low sodium diet and exercise  Plan to continue losartan 50 mg daily  No longer taking metoprolol and still well controlled

## 2022-04-08 NOTE — Assessment & Plan Note (Addendum)
For upcoming disc surgery with likely general anestesia  He takes no blood thinners or asa Will hold nsaid for a week prior to surgery  DM is fairly controlled with metformin and noted some wt loss  Last GFR was 70  CTA calcium score was 0 at last check  Reviewed EKG from this fall from cardiology Reviewed allergies  No restrictions for surgery

## 2022-04-08 NOTE — Assessment & Plan Note (Signed)
Currently taking zetia and crestor 20  Last CTA noted ca score of 0

## 2022-04-08 NOTE — Assessment & Plan Note (Signed)
Pt is optimg for disc surgery  Having moderate to severe back and leg symptoms

## 2022-04-18 ENCOUNTER — Encounter: Payer: No Typology Code available for payment source | Admitting: Physical Therapy

## 2022-06-13 ENCOUNTER — Other Ambulatory Visit: Payer: Self-pay | Admitting: Family Medicine

## 2022-06-21 LAB — HM DIABETES EYE EXAM

## 2022-06-25 ENCOUNTER — Telehealth: Payer: Self-pay | Admitting: Family Medicine

## 2022-06-25 MED ORDER — ROSUVASTATIN CALCIUM 40 MG PO TABS: 40.0000 mg | ORAL_TABLET | Freq: Every day | ORAL | 0 refills | Status: DC

## 2022-06-25 NOTE — Telephone Encounter (Signed)
Prescription Request  06/25/2022  LOV: 04/06/2022  What is the name of the medication or equipment? rosuvastatin (CRESTOR) 40 MG tablet   Have you contacted your pharmacy to request a refill? Yes   [place], [phone] [place] [address]   Labs       Patient notified that their request is being sent to the clinical staff for review and that they should receive a response within 2 business days.   Please advise at Mobile [phone] (mobile)

## 2022-07-05 ENCOUNTER — Encounter: Payer: Self-pay | Admitting: Family Medicine

## 2022-07-05 ENCOUNTER — Ambulatory Visit: Payer: BC Managed Care – PPO | Admitting: Family Medicine

## 2022-07-05 VITALS — BP 120/70 | HR 80 | Temp 98.1°F | Ht 69.0 in | Wt 208.0 lb

## 2022-07-05 DIAGNOSIS — L731 Pseudofolliculitis barbae: Secondary | ICD-10-CM

## 2022-07-05 DIAGNOSIS — R21 Rash and other nonspecific skin eruption: Secondary | ICD-10-CM

## 2022-07-05 DIAGNOSIS — L219 Seborrheic dermatitis, unspecified: Secondary | ICD-10-CM

## 2022-07-05 DIAGNOSIS — L2082 Flexural eczema: Secondary | ICD-10-CM | POA: Diagnosis not present

## 2022-07-05 DIAGNOSIS — L309 Dermatitis, unspecified: Secondary | ICD-10-CM | POA: Insufficient documentation

## 2022-07-05 MED ORDER — VALACYCLOVIR HCL 1 G PO TABS: 1000.0000 mg | ORAL_TABLET | Freq: Three times a day (TID) | ORAL | 0 refills | Status: DC

## 2022-07-05 NOTE — Assessment & Plan Note (Addendum)
Suspect former rash on elbows was this  Much improved with clobetasol and tacrolimus   This occurred in a later derm visit I do not have notes for   Enc pt to stop using scented clothing detergent / this was new and correlated to the problem
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
## 2022-07-05 NOTE — Assessment & Plan Note (Signed)
Note from [facility] reviewed today Tx with ketoconazole 2% cream and shampoo  Much improved/no sympt today

## 2022-07-05 NOTE — Assessment & Plan Note (Signed)
In a discreet area of L back with vesicles and erythema- some hyperpigmentation where former active rash was  Is painful  Zoster is in the differential Question whether all the topical steroids and tacrolimus may have prolonged it   This is not similar to the other rashes mentioned in his dermatology notes (those conditions are improved)   Will tx with valcyclovir 1 g tid for 7 d Inst to stop all topicals Keep clean with soap and water  Update if worse or not improving Update if pain control is needed

## 2022-07-05 NOTE — Assessment & Plan Note (Signed)
Reviewed note from [facility]  Using trentoin cream 0.05% along with benzoyl peroxide wash Much improved

## 2022-07-05 NOTE — Patient Instructions (Signed)
Your rash may be shingles I cannot say 100%  ? Why it has lasted so long Stop all topicals on this area  Keep clean with soap and water  Be gentle with it  Keep cool   A gentle cool compress is ok   Take the generic valtrex as directed   Update if not starting to improve in a week or if worsening    If not improving - back to dermatology

## 2022-07-14 ENCOUNTER — Encounter: Payer: Self-pay | Admitting: Family Medicine

## 2022-07-19 ENCOUNTER — Ambulatory Visit: Payer: BC Managed Care – PPO | Admitting: Family Medicine

## 2022-08-16 ENCOUNTER — Other Ambulatory Visit: Payer: Self-pay | Admitting: Urology

## 2022-08-23 ENCOUNTER — Telehealth: Payer: Self-pay | Admitting: Urology

## 2022-08-23 NOTE — Telephone Encounter (Signed)
Pt got a denial for Viagra and wants to know what he needs to do to get this refilled.

## 2022-08-24 ENCOUNTER — Other Ambulatory Visit: Payer: Self-pay | Admitting: *Deleted

## 2022-08-24 MED ORDER — SILDENAFIL CITRATE 100 MG PO TABS: 100.0000 mg | ORAL_TABLET | Freq: Every day | ORAL | 0 refills | Status: DC | PRN

## 2022-08-24 NOTE — Telephone Encounter (Signed)
Pt scheduled appt for Friday 6/14.  He only 1 Sildenafil left and wants to know if we can send in enough to last til his appt sent to [facility] in [place].

## 2022-08-24 NOTE — Telephone Encounter (Signed)
Patient called back, he had not heard back from the office. He would like to schedule a follow up appt but would like to know if this could be done virtually as he just started a new job 1 week ago and cannot take time off to come in to the clinic. Please Advise.

## 2022-09-06 ENCOUNTER — Other Ambulatory Visit: Payer: Self-pay | Admitting: Family Medicine

## 2022-09-06 NOTE — Telephone Encounter (Signed)
Advised patient to use the [employer] app.

## 2022-09-06 NOTE — Telephone Encounter (Signed)
Pt called to r/s appt, he is traveling w/work, coming in on 6/27.  He said [facility] sent him a text saying insurance didn't cover Viagra.

## 2022-09-07 ENCOUNTER — Ambulatory Visit: Payer: Self-pay | Admitting: Urology

## 2022-09-20 ENCOUNTER — Ambulatory Visit: Payer: Self-pay | Admitting: Urology

## 2022-10-10 ENCOUNTER — Ambulatory Visit: Payer: BC Managed Care – PPO | Admitting: Urology

## 2022-10-10 ENCOUNTER — Encounter: Payer: Self-pay | Admitting: Urology

## 2022-10-10 VITALS — BP 136/90 | HR 78 | Ht 69.0 in | Wt 213.0 lb

## 2022-10-10 DIAGNOSIS — F524 Premature ejaculation: Secondary | ICD-10-CM | POA: Diagnosis not present

## 2022-10-10 DIAGNOSIS — N529 Male erectile dysfunction, unspecified: Secondary | ICD-10-CM | POA: Diagnosis not present

## 2022-10-10 MED ORDER — SERTRALINE HCL 25 MG PO TABS: 25.0000 mg | ORAL_TABLET | Freq: Every day | ORAL | 0 refills | Status: DC

## 2022-10-10 MED ORDER — TADALAFIL 10 MG PO TABS: ORAL_TABLET | ORAL | 0 refills | Status: DC
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
## 2022-10-15 ENCOUNTER — Other Ambulatory Visit: Payer: Self-pay | Admitting: Nephrology

## 2022-10-15 DIAGNOSIS — N1831 Chronic kidney disease, stage 3a: Secondary | ICD-10-CM

## 2022-10-25 ENCOUNTER — Ambulatory Visit: Payer: BC Managed Care – PPO

## 2022-11-09 ENCOUNTER — Ambulatory Visit: Payer: BC Managed Care – PPO

## 2022-12-01 ENCOUNTER — Other Ambulatory Visit: Payer: Self-pay | Admitting: Cardiology

## 2022-12-01 DIAGNOSIS — E785 Hyperlipidemia, unspecified: Secondary | ICD-10-CM

## 2022-12-02 ENCOUNTER — Other Ambulatory Visit: Payer: Self-pay | Admitting: Family Medicine

## 2022-12-03 NOTE — Telephone Encounter (Signed)
Pt hasn't had his lipid checked in over a year. No future appts., please advise

## 2022-12-03 NOTE — Telephone Encounter (Signed)
Med refilled once, please schedule appt

## 2022-12-03 NOTE — Telephone Encounter (Signed)
Please schedule annual exam with fasting labs prior  Refill once Thanks

## 2022-12-04 NOTE — Telephone Encounter (Signed)
Spoke to pt, scheduled cpe for 12/28/22

## 2022-12-10 ENCOUNTER — Other Ambulatory Visit: Payer: Self-pay | Admitting: Family Medicine

## 2022-12-17 ENCOUNTER — Telehealth: Payer: Self-pay | Admitting: Family Medicine

## 2022-12-17 DIAGNOSIS — Z125 Encounter for screening for malignant neoplasm of prostate: Secondary | ICD-10-CM

## 2022-12-17 DIAGNOSIS — I10 Essential (primary) hypertension: Secondary | ICD-10-CM

## 2022-12-17 DIAGNOSIS — E119 Type 2 diabetes mellitus without complications: Secondary | ICD-10-CM

## 2022-12-17 DIAGNOSIS — E785 Hyperlipidemia, unspecified: Secondary | ICD-10-CM

## 2022-12-17 NOTE — Telephone Encounter (Signed)
-----   Message from Lovena Neighbours sent at 12/07/2022 11:29 AM EDT ----- Regarding: Labs for Wednesday 9.25.24 Please put physical lab orders in future. Thank you, Denny Peon

## 2022-12-19 ENCOUNTER — Other Ambulatory Visit: Payer: BC Managed Care – PPO

## 2022-12-19 DIAGNOSIS — E119 Type 2 diabetes mellitus without complications: Secondary | ICD-10-CM

## 2022-12-19 DIAGNOSIS — I10 Essential (primary) hypertension: Secondary | ICD-10-CM

## 2022-12-19 DIAGNOSIS — E785 Hyperlipidemia, unspecified: Secondary | ICD-10-CM | POA: Diagnosis not present

## 2022-12-19 DIAGNOSIS — E1169 Type 2 diabetes mellitus with other specified complication: Secondary | ICD-10-CM | POA: Diagnosis not present

## 2022-12-19 DIAGNOSIS — Z125 Encounter for screening for malignant neoplasm of prostate: Secondary | ICD-10-CM

## 2022-12-19 LAB — COMPREHENSIVE METABOLIC PANEL
ALT: 26 U/L (ref 0–53)
AST: 21 U/L (ref 0–37)
Albumin: 4.7 g/dL (ref 3.5–5.2)
Alkaline Phosphatase: 41 U/L (ref 39–117)
BUN: 18 mg/dL (ref 6–23)
CO2: 29 mEq/L (ref 19–32)
Calcium: 9.6 mg/dL (ref 8.4–10.5)
Chloride: 103 mEq/L (ref 96–112)
Creatinine, Ser: 1.14 mg/dL (ref 0.40–1.50)
GFR: 74.22 mL/min (ref >60.00)
Glucose, Bld: 143 mg/dL — ABNORMAL HIGH (ref 70–99)
Potassium: 4.5 mEq/L (ref 3.5–5.1)
Sodium: 140 mEq/L (ref 135–145)
Total Bilirubin: 0.9 mg/dL (ref 0.2–1.2)
Total Protein: 6.9 g/dL (ref 6.0–8.3)

## 2022-12-19 LAB — CBC WITH DIFFERENTIAL/PLATELET
Basophils Absolute: 0 10*3/uL (ref 0.0–0.1)
Basophils Relative: 0.4 % (ref 0.0–3.0)
Eosinophils Absolute: 0.1 10*3/uL (ref 0.0–0.7)
Eosinophils Relative: 3.2 % (ref 0.0–5.0)
HCT: 48.3 % (ref 39.0–52.0)
Hemoglobin: 16 g/dL (ref 13.0–17.0)
Lymphocytes Relative: 49.1 % — ABNORMAL HIGH (ref 12.0–46.0)
Lymphs Abs: 2 10*3/uL (ref 0.7–4.0)
MCHC: 33 g/dL (ref 30.0–36.0)
MCV: 87.3 fl (ref 78.0–100.0)
Monocytes Absolute: 0.2 10*3/uL (ref 0.1–1.0)
Monocytes Relative: 5 % (ref 3.0–12.0)
Neutro Abs: 1.7 10*3/uL (ref 1.4–7.7)
Neutrophils Relative %: 42.3 % — ABNORMAL LOW (ref 43.0–77.0)
Platelets: 222 10*3/uL (ref 150.0–400.0)
RBC: 5.54 Mil/uL (ref 4.22–5.81)
RDW: 13.9 % (ref 11.5–15.5)
WBC: 4.1 10*3/uL (ref 4.0–10.5)

## 2022-12-19 LAB — LIPID PANEL
Cholesterol: 142 mg/dL (ref 0–200)
HDL: 45.1 mg/dL (ref >39.00)
LDL Cholesterol: 66 mg/dL (ref 0–99)
NonHDL: 96.92
Total CHOL/HDL Ratio: 3
Triglycerides: 153 mg/dL — ABNORMAL HIGH (ref 0.0–149.0)
VLDL: 30.6 mg/dL (ref 0.0–40.0)

## 2022-12-19 LAB — TSH: TSH: 2.57 u[IU]/mL (ref 0.35–5.50)

## 2022-12-19 LAB — HEMOGLOBIN A1C: Hgb A1c MFr Bld: 6.9 % — ABNORMAL HIGH (ref 4.6–6.5)

## 2022-12-19 LAB — MICROALBUMIN / CREATININE URINE RATIO
Creatinine,U: 71.8 mg/dL
Microalb Creat Ratio: 1 mg/g (ref 0.0–30.0)
Microalb, Ur: <0.7 mg/dL (ref 0.0–1.9)

## 2022-12-19 LAB — PSA: PSA: 0.5 ng/mL (ref 0.10–4.00)

## 2022-12-28 ENCOUNTER — Encounter: Payer: BC Managed Care – PPO | Admitting: Family Medicine
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
## 2023-01-10 ENCOUNTER — Ambulatory Visit (INDEPENDENT_AMBULATORY_CARE_PROVIDER_SITE_OTHER): Payer: BC Managed Care – PPO | Admitting: Family Medicine

## 2023-01-10 ENCOUNTER — Encounter: Payer: Self-pay | Admitting: Family Medicine

## 2023-01-10 ENCOUNTER — Other Ambulatory Visit: Payer: Self-pay | Admitting: Cardiology

## 2023-01-10 VITALS — BP 128/80 | HR 89 | Temp 98.3°F | Ht 68.0 in | Wt 211.2 lb

## 2023-01-10 DIAGNOSIS — Z125 Encounter for screening for malignant neoplasm of prostate: Secondary | ICD-10-CM | POA: Diagnosis not present

## 2023-01-10 DIAGNOSIS — Z0000 Encounter for general adult medical examination without abnormal findings: Secondary | ICD-10-CM

## 2023-01-10 DIAGNOSIS — Z6832 Body mass index (BMI) 32.0-32.9, adult: Secondary | ICD-10-CM

## 2023-01-10 DIAGNOSIS — I10 Essential (primary) hypertension: Secondary | ICD-10-CM | POA: Diagnosis not present

## 2023-01-10 DIAGNOSIS — R21 Rash and other nonspecific skin eruption: Secondary | ICD-10-CM

## 2023-01-10 DIAGNOSIS — E1169 Type 2 diabetes mellitus with other specified complication: Secondary | ICD-10-CM

## 2023-01-10 DIAGNOSIS — N183 Chronic kidney disease, stage 3 unspecified: Secondary | ICD-10-CM | POA: Diagnosis not present

## 2023-01-10 DIAGNOSIS — E785 Hyperlipidemia, unspecified: Secondary | ICD-10-CM

## 2023-01-10 DIAGNOSIS — E119 Type 2 diabetes mellitus without complications: Secondary | ICD-10-CM

## 2023-01-10 DIAGNOSIS — Z23 Encounter for immunization: Secondary | ICD-10-CM | POA: Diagnosis not present

## 2023-01-10 DIAGNOSIS — H3500 Unspecified background retinopathy: Secondary | ICD-10-CM

## 2023-01-10 DIAGNOSIS — Z7984 Long term (current) use of oral hypoglycemic drugs: Secondary | ICD-10-CM

## 2023-01-10 MED ORDER — EZETIMIBE 10 MG PO TABS
10.0000 mg | ORAL_TABLET | Freq: Every day | ORAL | 3 refills | Status: DC
Start: 2023-01-10 — End: 2023-10-15

## 2023-01-10 MED ORDER — LOSARTAN POTASSIUM 50 MG PO TABS: 50.0000 mg | ORAL_TABLET | Freq: Every day | ORAL | 3 refills | Status: DC

## 2023-01-10 NOTE — Assessment & Plan Note (Signed)
Reviewed health habits including diet and exercise and skin cancer prevention Reviewed appropriate screening tests for age  Also reviewed health mt list, fam hx and immunization status , as well as social and family history   See HPI Labs reviewed and ordered Flu shot today  Shingrix shot today  Psa stable  Colonoscopy 01/2018 with 10 y recall (hyperplastic polyps) Discussed fall prevention, supplements and exercise for bone density   Eye care utd  PHQ 2 due to fatigue

## 2023-01-10 NOTE — Assessment & Plan Note (Signed)
GFR of 74.2 recently  Improved   Taking arb Blood pressure is controlled

## 2023-01-10 NOTE — Assessment & Plan Note (Signed)
Lab Results  Component Value Date   PSA 0.50 12/19/2022   PSA 0.43 11/16/2021    No voiding symptoms or nocturia  No fam history of prostate cancer

## 2023-01-10 NOTE — Assessment & Plan Note (Signed)
bp in fair control at this time  BP Readings from Last 1 Encounters:  01/10/23 128/80   No changes needed Most recent labs reviewed  Disc lifstyle change with low sodium diet and exercise  Plan to continue losartan 50 mg daily

## 2023-01-10 NOTE — Patient Instructions (Addendum)
Continue walking/ jogging   Add some strength training to your routine, this is important for bone and brain health and can reduce your risk of falls and help your body use insulin properly and regulate weight  Light weights, exercise bands , and internet videos are a good way to start  Yoga (chair or regular), machines , floor exercises or a gym with machines are also good options    Eating and drinking sweets can cause worse cravings  Try to get most of your carbohydrates from produce (with the exception of white potatoes) and whole grains Eat less bread/pasta/rice/snack foods/cereals/sweets and other items from the middle of the grocery store (processed carbs)  Get back on generic zetia  Continue crestor   Flu shot today  Shingrix shot today

## 2023-01-10 NOTE — Assessment & Plan Note (Signed)
Discussed how this problem influences overall health and the risks it imposes  Reviewed plan for weight loss with lower calorie diet (via better food choices (lower glycemic and portion control) along with exercise building up to or more than 30 minutes 5 days per week including some aerobic activity and strength training

## 2023-01-10 NOTE — Assessment & Plan Note (Signed)
Continues yearly eye exams  Utd from 05/2022

## 2023-01-10 NOTE — Assessment & Plan Note (Addendum)
Disc goals for lipids and reasons to control them Rev last labs with pt Rev low sat fat diet in detail Much improved with crestor 20 mg and zetia 10 mg daily  Refilled zetia (recently ran out of) LDL at goal , 66 Trid down to 153

## 2023-01-10 NOTE — Assessment & Plan Note (Addendum)
This ended up getting worse   Intermittent/ very itchy  Saw dermatology -dx with notalgia paresthetica  Reviewed those notes today Using topical clobetasol and xyzal prn  Planning patch (allergy) testing next week

## 2023-01-10 NOTE — Assessment & Plan Note (Addendum)
  Lab Results  Component Value Date   HGBA1C 6.9 (H) 12/19/2022   Fair control with metformin XR 500 mg daily and has lost wt  Did eat more sweets/ more soda this past 3 months and thinks he can do better  Tolerates this well  Last GFR reassuring  Taking statin and arb Microalb utd Eye exam utd - does have history of retinopathy in past from HTN (not dm)  Follow up 3 mo

## 2023-03-26 ENCOUNTER — Other Ambulatory Visit: Payer: Self-pay | Admitting: Family Medicine

## 2023-04-15 ENCOUNTER — Ambulatory Visit: Payer: BC Managed Care – PPO | Admitting: Urology

## 2023-04-18 ENCOUNTER — Ambulatory Visit: Payer: BC Managed Care – PPO | Admitting: Urology

## 2023-04-21 ENCOUNTER — Ambulatory Visit
Admission: EM | Admit: 2023-04-21 | Discharge: 2023-04-21 | Disposition: A | Discharge status: Home / Self Care | Payer: 59 | Attending: Emergency Medicine | Admitting: Emergency Medicine

## 2023-04-21 ENCOUNTER — Telehealth: Payer: Self-pay

## 2023-04-21 DIAGNOSIS — K649 Unspecified hemorrhoids: Secondary | ICD-10-CM | POA: Diagnosis not present

## 2023-04-21 LAB — POC HEMOCCULT BLD/STL (OFFICE/1-CARD/DIAGNOSTIC): Fecal Occult Blood, POC: NEGATIVE

## 2023-04-21 MED ORDER — HYDROCORTISONE ACETATE 25 MG RE SUPP: 25.0000 mg | Freq: Two times a day (BID) | RECTAL | 0 refills | Status: DC

## 2023-04-21 MED ORDER — HYDROCORTISONE ACETATE 25 MG RE SUPP
25.0000 mg | Freq: Two times a day (BID) | RECTAL | 0 refills | Status: DC
Start: 2023-04-21 — End: 2023-04-21

## 2023-04-21 NOTE — Telephone Encounter (Signed)
Prescription changed to other pharmacy on file per patient's request- patient states initial pharmacy chosen does not open until 10am.
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
## 2023-04-21 NOTE — Discharge Instructions (Addendum)
Use the hydrocortisone suppositories as directed.  Take a daily fiber supplement such as Metamucil.  Take a daily stool softener such as Colace.  If needed, take MiraLAX.  Follow-up with your primary care provider or a GI specialist.  Go to the emergency department if you have worsening symptoms.

## 2023-04-21 NOTE — ED Triage Notes (Signed)
Patient to Urgent Care with complaints of painful hemorrhoids. Denies any bleeding.   Symptoms started two days ago. Started using preparation H yesterday, took a sitz bath yesterday, using tylenol.

## 2023-05-01 ENCOUNTER — Other Ambulatory Visit: Payer: Self-pay | Admitting: Nephrology

## 2023-05-01 DIAGNOSIS — N1831 Chronic kidney disease, stage 3a: Secondary | ICD-10-CM

## 2023-05-08 ENCOUNTER — Encounter: Payer: Self-pay | Admitting: Family Medicine

## 2023-05-08 ENCOUNTER — Ambulatory Visit (INDEPENDENT_AMBULATORY_CARE_PROVIDER_SITE_OTHER)
Admission: RE | Admit: 2023-05-08 | Discharge: 2023-05-08 | Disposition: A | Discharge status: Home / Self Care | Payer: 59 | Source: Ambulatory Visit | Attending: Family Medicine | Admitting: Family Medicine

## 2023-05-08 ENCOUNTER — Ambulatory Visit (INDEPENDENT_AMBULATORY_CARE_PROVIDER_SITE_OTHER): Payer: 59 | Admitting: Family Medicine

## 2023-05-08 VITALS — BP 124/84 | HR 98 | Temp 98.3°F | Ht 68.0 in | Wt 214.0 lb

## 2023-05-08 DIAGNOSIS — Z23 Encounter for immunization: Secondary | ICD-10-CM | POA: Diagnosis not present

## 2023-05-08 DIAGNOSIS — Z7984 Long term (current) use of oral hypoglycemic drugs: Secondary | ICD-10-CM

## 2023-05-08 DIAGNOSIS — E119 Type 2 diabetes mellitus without complications: Secondary | ICD-10-CM | POA: Diagnosis not present

## 2023-05-08 DIAGNOSIS — S82831A Other fracture of upper and lower end of right fibula, initial encounter for closed fracture: Secondary | ICD-10-CM

## 2023-05-08 DIAGNOSIS — S99911A Unspecified injury of right ankle, initial encounter: Secondary | ICD-10-CM

## 2023-05-08 DIAGNOSIS — S82401A Unspecified fracture of shaft of right fibula, initial encounter for closed fracture: Secondary | ICD-10-CM | POA: Insufficient documentation

## 2023-05-08 LAB — POCT GLYCOSYLATED HEMOGLOBIN (HGB A1C): Hemoglobin A1C: 7.1 % — AB (ref 4.0–5.6)

## 2023-05-08 NOTE — Patient Instructions (Addendum)
Elevate foot when you can  Use cold compress as often as possible for 10 minutes at a time   Get a compression ankle wrap over the counter or use ace bandage   Voltaren gel 1% over the counter is ok to up to four times daily    Xray now  We will reach out with results when it gets read and then make a plan from there    A1c today   Shingles shot today

## 2023-05-08 NOTE — Assessment & Plan Note (Addendum)
Two inversion injuries  A month ago and just over a week ago  Caused by stepping in the same hole twice   Pain is lateral with little to no swelling Xray notes possible small fracture within distal fibula (medial side)   Recommend ice/ elevation  Compression prn  Voltaren gel over the counter  Will discuss ortho referral
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
## 2023-05-08 NOTE — Assessment & Plan Note (Signed)
Recently nephrology increased pt's metformin xr 500 mg from once to twice daily   Lab Results  Component Value Date   HGBA1C 6.9 (H) 12/19/2022   HGBA1C 6.9 (A) 02/28/2022   HGBA1C 6.8 (H) 11/16/2021     disc imp of low glycemic diet and wt loss to prevent DM2

## 2023-05-09 NOTE — Telephone Encounter (Signed)
Please let him know when you have contact info  Thanks !

## 2023-05-10 ENCOUNTER — Ambulatory Visit (INDEPENDENT_AMBULATORY_CARE_PROVIDER_SITE_OTHER): Payer: 59 | Admitting: Student

## 2023-05-10 ENCOUNTER — Encounter (HOSPITAL_BASED_OUTPATIENT_CLINIC_OR_DEPARTMENT_OTHER): Payer: Self-pay | Admitting: Student

## 2023-05-10 DIAGNOSIS — S82839A Other fracture of upper and lower end of unspecified fibula, initial encounter for closed fracture: Secondary | ICD-10-CM | POA: Diagnosis not present
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
## 2023-05-13 ENCOUNTER — Telehealth: Payer: Self-pay | Admitting: Student

## 2023-05-13 ENCOUNTER — Telehealth (HOSPITAL_BASED_OUTPATIENT_CLINIC_OR_DEPARTMENT_OTHER): Payer: Self-pay | Admitting: Student

## 2023-05-13 NOTE — Telephone Encounter (Signed)
Patient called asked if he can get a note stating he can work without restrictions. Patient said he can print the note from [employer] for his employer. The number to contact patient is [phone]

## 2023-05-13 NOTE — Telephone Encounter (Signed)
Can you resend the work note to [employer] because he was trying to print it out and deleted it.

## 2023-05-16 ENCOUNTER — Encounter (HOSPITAL_BASED_OUTPATIENT_CLINIC_OR_DEPARTMENT_OTHER): Payer: Self-pay

## 2023-05-20 IMAGING — US US SCROTUM W/ DOPPLER COMPLETE
1 series · 14 of 25 positions shown · non-contrast
Comparison: None.

CLINICAL DATA: Scrotal pain

EXAM:
SCROTAL ULTRASOUND
DOPPLER ULTRASOUND OF THE TESTICLES
TECHNIQUE: Complete ultrasound examination of the testicles, epididymis, and
other scrotal structures was performed. Color and spectral Doppler
ultrasound were also utilized to evaluate blood flow to the
testicles.

[Series 1: us scrotum w/doppler · 14 of 65 slices shown]
[im 1/65]
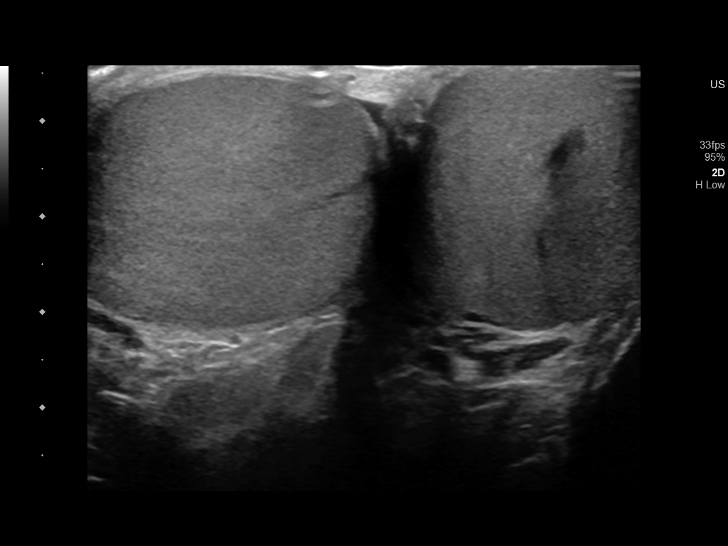
[im 6/65]
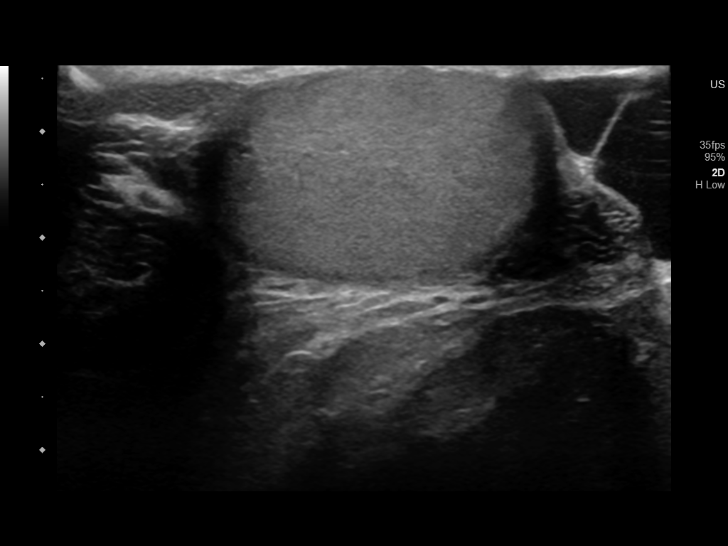
[im 11/65]
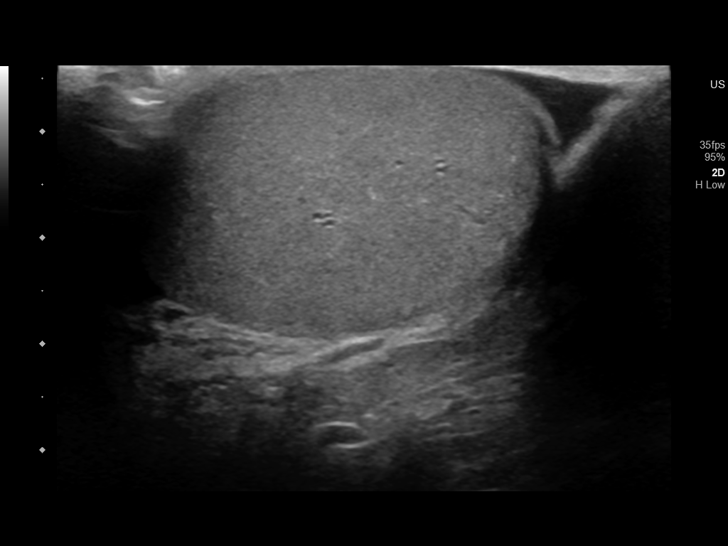
[im 17/65]
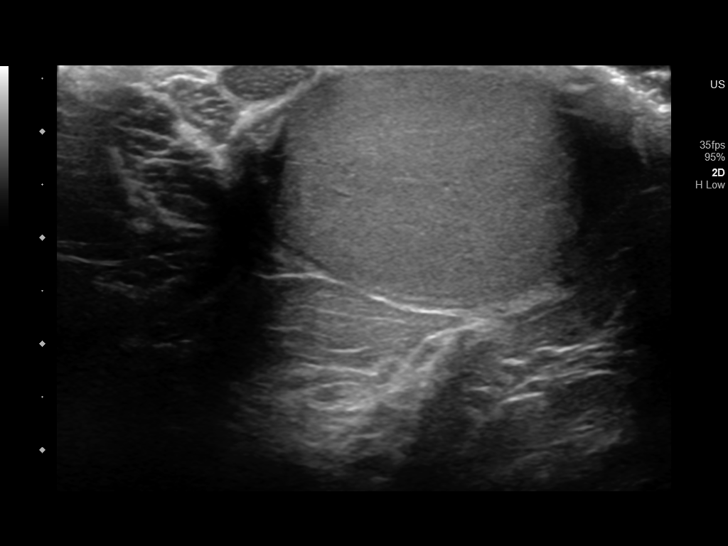
[im 22/65]
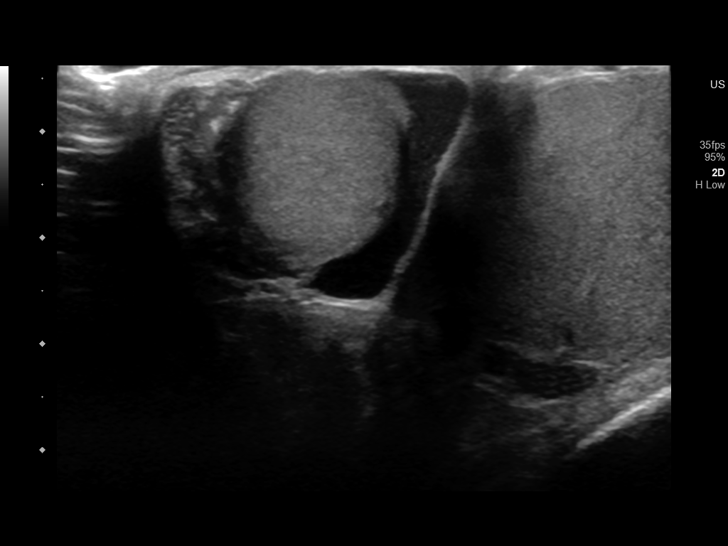
[im 25/65]
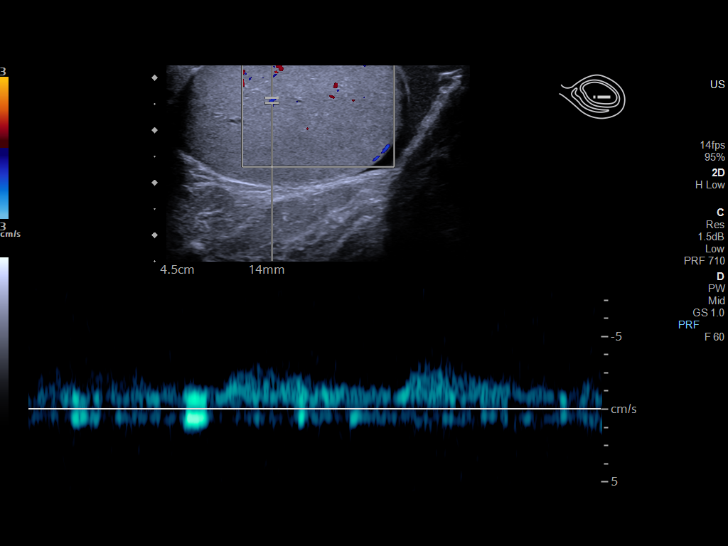
[im 30/65]
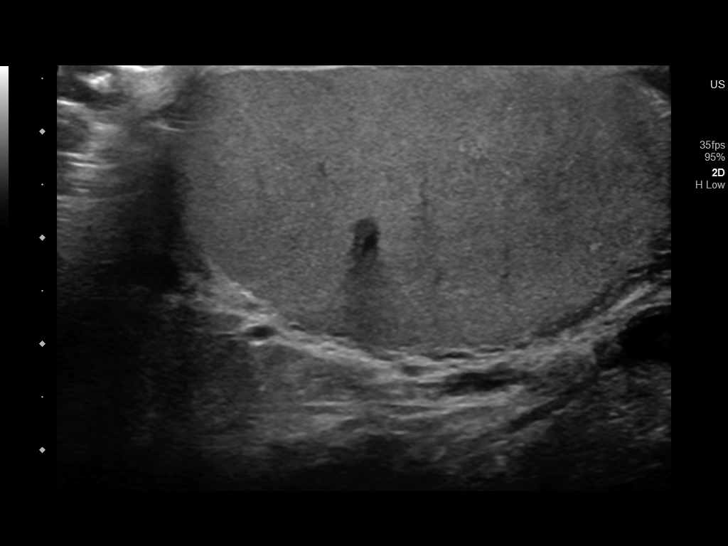
[im 35/65]
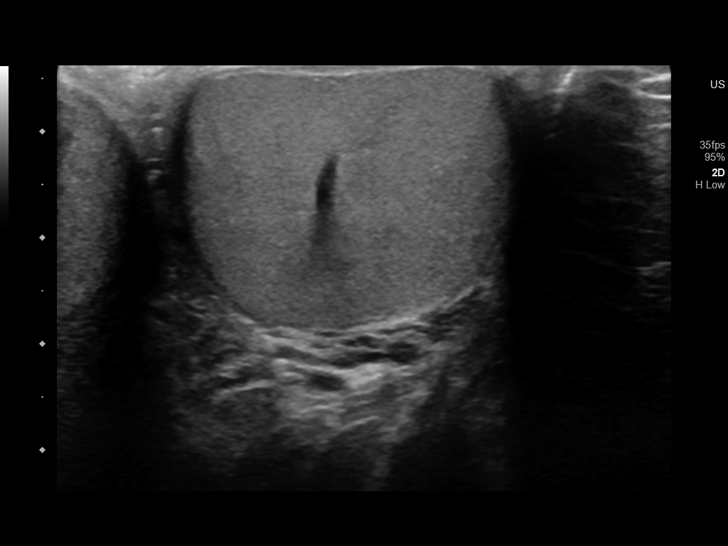
[im 41/65]
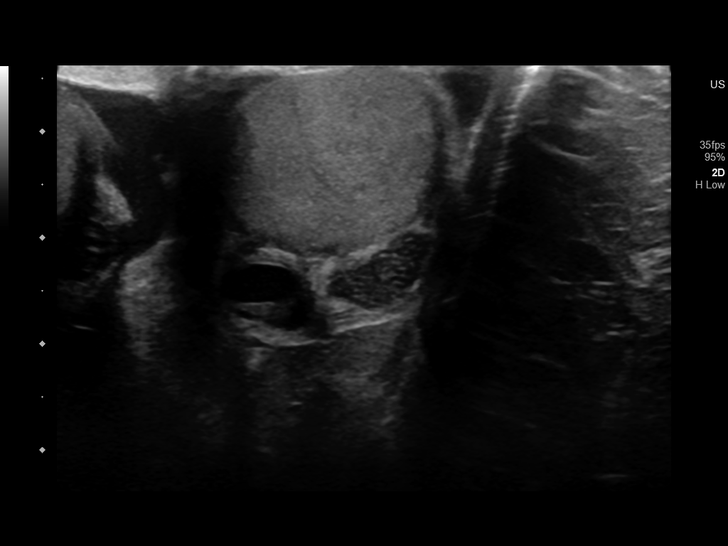
[im 43/65]
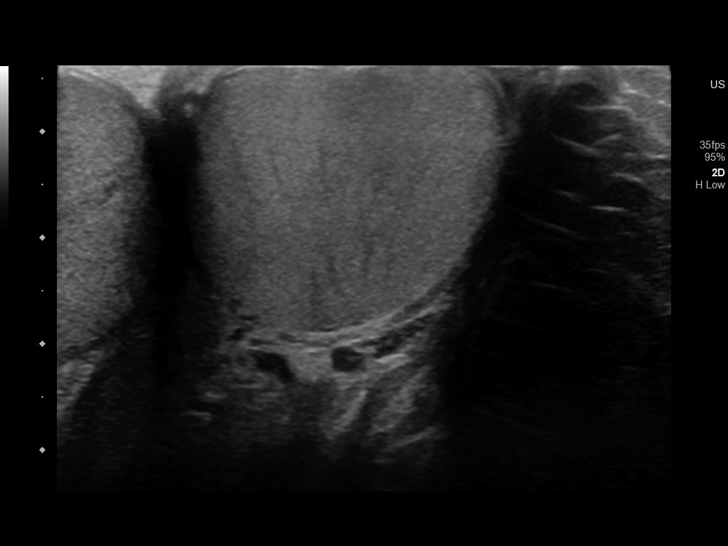
[im 49/65]
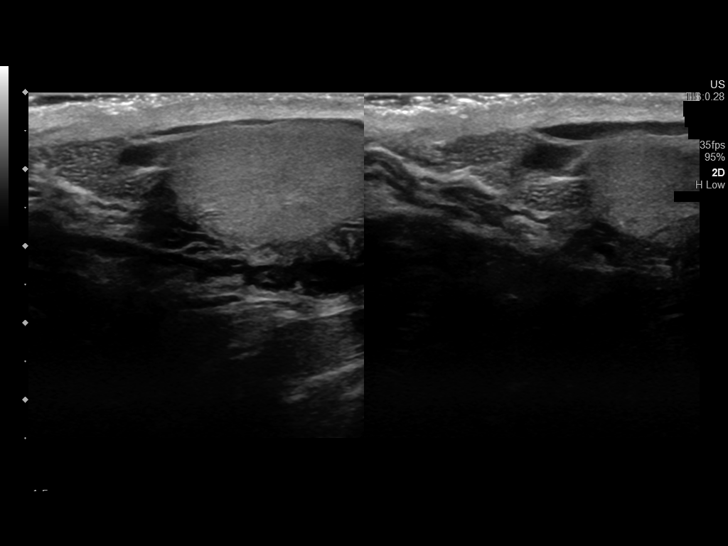
[im 54/65]
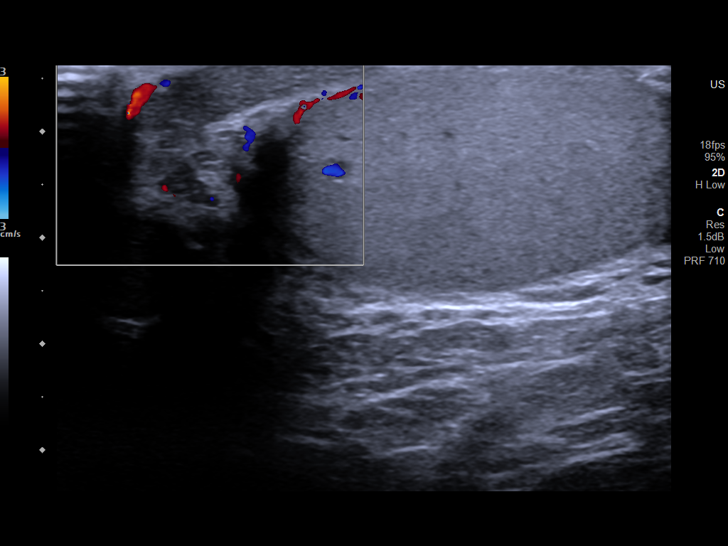
[im 59/65]
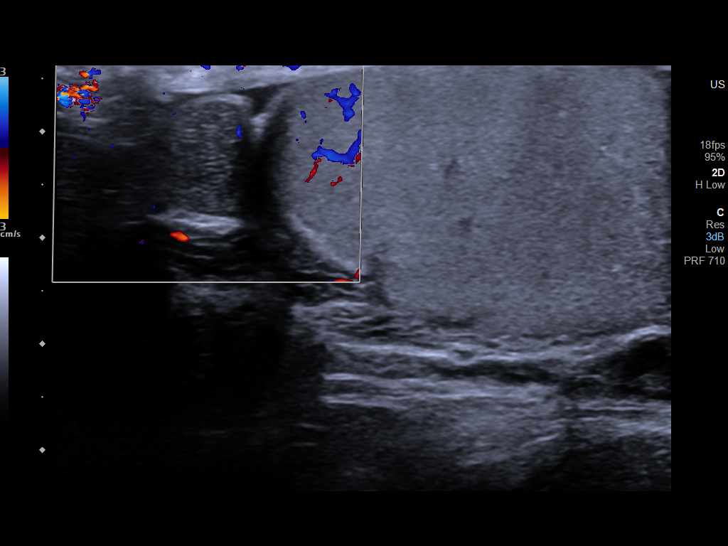
[im 65/65]
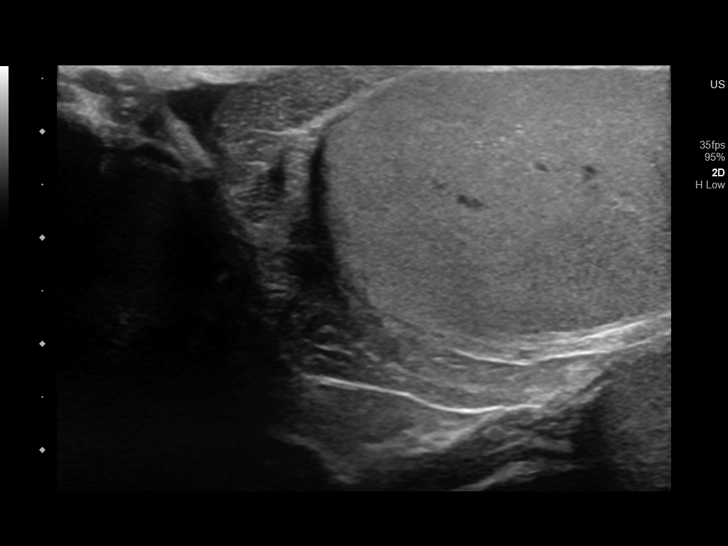

[14 of 25 positions shown; findings below may reference images not displayed]

FINDINGS: Right testicle

Measurements: 4.5 x 2.6 x 3.5 cm. No mass or microlithiasis
visualized.

Left testicle

Measurements: 5.1 x 2.6 x 3.1 cm. No mass or microlithiasis
visualized.

Right epididymis:  Normal in size and appearance.

Left epididymis:  Normal in size and appearance.

Hydrocele:  None visualized.

Varicocele:  None visualized.

Pulsed Doppler interrogation of both testes demonstrates normal low
resistance arterial and venous waveforms bilaterally.
IMPRESSION: Normal study.  No testicular mass or evidence of torsion.

## 2023-05-28 ENCOUNTER — Ambulatory Visit (HOSPITAL_BASED_OUTPATIENT_CLINIC_OR_DEPARTMENT_OTHER)

## 2023-05-28 ENCOUNTER — Ambulatory Visit (HOSPITAL_BASED_OUTPATIENT_CLINIC_OR_DEPARTMENT_OTHER): Payer: 59 | Admitting: Student

## 2023-05-28 DIAGNOSIS — S82839A Other fracture of upper and lower end of unspecified fibula, initial encounter for closed fracture: Secondary | ICD-10-CM | POA: Diagnosis not present

## 2023-05-28 DIAGNOSIS — S82831A Other fracture of upper and lower end of right fibula, initial encounter for closed fracture: Secondary | ICD-10-CM | POA: Diagnosis not present
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
## 2023-05-31 ENCOUNTER — Telehealth: Payer: Self-pay | Admitting: *Deleted

## 2023-05-31 NOTE — Telephone Encounter (Signed)
 Copied from CRM [id number]. Topic: General - Other >> May 31, 2023  9:36 AM Kathryne Eriksson wrote: Reason for CRM: Requesting Documentation >> May 31, 2023  9:42 AM Kathryne Eriksson wrote: Patient called, stating he applied for short and long term disability coverage through this job. He received notice back that he was denied because he answered stating he has diabetes, instead reporting prediabetes. Patient states they're wanting some supporting documentation from Tower, Audrie Gallus, MD in regards to patient having prediabetes. If any further information is needed, patient call back number is [phone]. Patient is needing for it to show that he's in good health and comes regularly per routine for his visits.

## 2023-05-31 NOTE — Telephone Encounter (Signed)
 Lab Results  Component Value Date   HGBA1C 7.1 (A) 05/08/2023   This is in the diabetes range  He is diabetic and also on metformin I think

## 2023-06-03 NOTE — Telephone Encounter (Signed)
Pt notified of Dr. Tower's comments and verbalized understanding  

## 2023-06-15 ENCOUNTER — Other Ambulatory Visit: Payer: Self-pay | Admitting: Family Medicine

## 2023-06-25 LAB — HM DIABETES EYE EXAM

## 2023-06-28 ENCOUNTER — Ambulatory Visit (HOSPITAL_BASED_OUTPATIENT_CLINIC_OR_DEPARTMENT_OTHER): Payer: 59 | Admitting: Student

## 2023-07-30 ENCOUNTER — Other Ambulatory Visit: Payer: Self-pay | Admitting: Cardiology

## 2023-07-30 DIAGNOSIS — E785 Hyperlipidemia, unspecified: Secondary | ICD-10-CM

## 2023-08-02 ENCOUNTER — Telehealth: Payer: Self-pay | Admitting: Family Medicine

## 2023-08-02 MED ORDER — METFORMIN HCL ER 500 MG PO TB24: 500.0000 mg | ORAL_TABLET | Freq: Every day | ORAL | 0 refills | Status: DC

## 2023-08-02 NOTE — Telephone Encounter (Signed)
 Copied from CRM [id number]. Topic: Clinical - Medication Refill >> Aug 02, 2023 10:09 AM Rosamond Comes wrote: Patient call requesting refill   Medication: metFORMIN  (GLUCOPHAGE -XR) 500 MG 24 hr tablet  Has the patient contacted their pharmacy? Yes, spoke to [facility] and wants refill to go to [facility]  (Agent: If no, request that the patient contact the pharmacy for the refill. If patient does not wish to contact the pharmacy document the reason why and proceed with request.) (Agent: If yes, when and what did the pharmacy advise?)  This is the patient's preferred pharmacy:   [id number], [place] - [address] AT [facility] [address] [place] [address] Phone: [phone] Fax: [phone]    Is this the correct pharmacy for this prescription? Yes If no, delete pharmacy and type the correct one.   Has the prescription been filled recently? Yes  Is the patient out of the medication? Yes  Has the patient been seen for an appointment in the last year OR does the patient have an upcoming appointment? Yes  Can we respond through MyChart? Yes  Agent: Please be advised that Rx refills may take up to 3 business days. We ask that you follow-up with your pharmacy.

## 2023-10-10 ENCOUNTER — Ambulatory Visit: Payer: Self-pay | Admitting: Urology

## 2023-10-10 ENCOUNTER — Other Ambulatory Visit: Payer: Self-pay | Admitting: Family Medicine

## 2023-10-10 NOTE — Telephone Encounter (Signed)
 Copied from CRM [id number]. Topic: Clinical - Medication Refill >> Oct 10, 2023  9:39 AM Chiquita SQUIBB wrote: Medication: metFORMIN  (GLUCOPHAGE -XR) 500 MG 24 hr tablet  Has the patient contacted their pharmacy? Yes (Agent: If no, request that the patient contact the pharmacy for the refill. If patient does not wish to contact the pharmacy document the reason why and proceed with request.) (Agent: If yes, when and what did the pharmacy advise?)  This is the patient's preferred pharmacy:  [facility] [address] [place], [place] - [address] [place] [address] Phone: [phone] Fax: [phone]  Is this the correct pharmacy for this prescription? Yes If no, delete pharmacy and type the correct one.   Has the prescription been filled recently? No  Is the patient out of the medication? Yes  Has the patient been seen for an appointment in the last year OR does the patient have an upcoming appointment? Yes  Can we respond through MyChart? Yes  Agent: Please be advised that Rx refills may take up to 3 business days. We ask that you follow-up with your pharmacy.

## 2023-10-11 NOTE — Telephone Encounter (Signed)
 Pt was suppose to f/u in May for his DM. Please schedule DM f/u before I refill med. Please schedule appt (no labs prior) and then send back to me to refill once appt has been scheduled. Thanks

## 2023-10-14 MED ORDER — METFORMIN HCL ER 500 MG PO TB24: 500.0000 mg | ORAL_TABLET | Freq: Every day | ORAL | 0 refills | Status: DC

## 2023-10-15 ENCOUNTER — Ambulatory Visit: Payer: Self-pay | Admitting: Family Medicine

## 2023-10-15 ENCOUNTER — Encounter: Payer: Self-pay | Admitting: Family Medicine

## 2023-10-15 ENCOUNTER — Ambulatory Visit: Admitting: Family Medicine

## 2023-10-15 VITALS — BP 128/84 | HR 69 | Temp 98.3°F | Ht 68.0 in | Wt 213.1 lb

## 2023-10-15 DIAGNOSIS — E119 Type 2 diabetes mellitus without complications: Secondary | ICD-10-CM | POA: Diagnosis not present

## 2023-10-15 DIAGNOSIS — E1169 Type 2 diabetes mellitus with other specified complication: Secondary | ICD-10-CM

## 2023-10-15 DIAGNOSIS — N183 Chronic kidney disease, stage 3 unspecified: Secondary | ICD-10-CM

## 2023-10-15 DIAGNOSIS — Z7984 Long term (current) use of oral hypoglycemic drugs: Secondary | ICD-10-CM | POA: Diagnosis not present

## 2023-10-15 DIAGNOSIS — I10 Essential (primary) hypertension: Secondary | ICD-10-CM

## 2023-10-15 DIAGNOSIS — E785 Hyperlipidemia, unspecified: Secondary | ICD-10-CM

## 2023-10-15 LAB — POCT GLYCOSYLATED HEMOGLOBIN (HGB A1C): Hemoglobin A1C: 6.8 % — AB (ref 4.0–5.6)

## 2023-10-15 LAB — MICROALBUMIN / CREATININE URINE RATIO
Creatinine,U: 138.7 mg/dL
Microalb Creat Ratio: 24.5 mg/g (ref 0.0–30.0)
Microalb, Ur: 3.4 mg/dL — ABNORMAL HIGH (ref 0.0–1.9)

## 2023-10-15 MED ORDER — EZETIMIBE 10 MG PO TABS: 10.0000 mg | ORAL_TABLET | Freq: Every day | ORAL | 3 refills | Status: AC
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
## 2023-10-15 NOTE — Assessment & Plan Note (Signed)
 Disc goals for lipids and reasons to control them Rev last labs with pt Rev low sat fat diet in detail Much improved with crestor  20 mg and zetia  10 mg daily  Out of zetia  again-encouraged to refill this -drny iy in  Goal LDL under 70 Diet is also improved Will re check in the fall

## 2023-10-15 NOTE — Assessment & Plan Note (Signed)
 Continues nephrology care  Last GFR here was in normal range   On low dose metformin  and arb   Microalb today

## 2023-10-15 NOTE — Patient Instructions (Addendum)
 Try to get most of your carbohydrates from produce (with the exception of white potatoes) and whole grains Eat less bread/pasta/rice/snack foods/cereals/sweets and other items from the middle of the grocery store (processed carbs)  Lots of lean protein  The following are examples of protein in diet  Meat (lean)  Fish  Eggs  Dairy products  Soy products  Oat milk  Almond milk Nuts and nut butters  Legumes  Dried beans   Get back on zetia    Keep up the great exercise   Urine protein test today

## 2023-10-15 NOTE — Assessment & Plan Note (Signed)
 bp in fair control at this time  BP Readings from Last 1 Encounters:  10/15/23 128/84   No changes needed Most recent labs reviewed  Disc lifstyle change with low sodium diet and exercise  Plan to continue losartan  50 mg daily   Also continue nephrology care

## 2023-10-15 NOTE — Assessment & Plan Note (Signed)
 Lab Results  Component Value Date   HGBA1C 6.8 (A) 10/15/2023   HGBA1C 7.1 (A) 05/08/2023   HGBA1C 6.9 (H) 12/19/2022   Continues metformin  xr 500 mg daily (no longer bid) Better diet and exercise -commended  Microalb today  Normal foot exam Eye exam utd  Sees nephrology   Encouraged to continue better habits
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
## 2023-11-05 ENCOUNTER — Ambulatory Visit: Admitting: Urology

## 2023-11-08 ENCOUNTER — Other Ambulatory Visit: Payer: Self-pay | Admitting: Family Medicine

## 2023-11-08 MED ORDER — ROSUVASTATIN CALCIUM 40 MG PO TABS: 40.0000 mg | ORAL_TABLET | Freq: Every day | ORAL | 0 refills | Status: DC

## 2023-11-08 NOTE — Telephone Encounter (Signed)
 Rx sent electronically.

## 2023-11-08 NOTE — Telephone Encounter (Signed)
 Copied from CRM [id number]. Topic: Clinical - Medication Refill >> Nov 08, 2023 11:53 AM Rea C wrote: Medication: rosuvastatin  (CRESTOR ) 40 MG tablet  Has the patient contacted their pharmacy? Patient called [facility]. But since they doesn't have the prescription from us  yet, they needed the patient to call us  for the refill. Patient no longer uses [facility].   This is the patient's preferred pharmacy:  [facility] [address] [place], [place] - [address] [place] [address] Phone: [phone] Fax: [phone]  Is this the correct pharmacy for this prescription? Yes If no, delete pharmacy and type the correct one.   Has the prescription been filled recently? Yes  Is the patient out of the medication? Patient has about 3 or 4 days left.   Has the patient been seen for an appointment in the last year OR does the patient have an upcoming appointment? Yes  Can we respond through MyChart? Yes   Agent: Please be advised that Rx refills may take up to 3 business days. We ask that you follow-up with your pharmacy.

## 2023-11-20 ENCOUNTER — Ambulatory Visit: Admitting: Urology

## 2023-12-29 ENCOUNTER — Other Ambulatory Visit: Payer: Self-pay | Admitting: Family Medicine

## 2024-01-05 ENCOUNTER — Telehealth: Payer: Self-pay | Admitting: Family Medicine

## 2024-01-05 DIAGNOSIS — E119 Type 2 diabetes mellitus without complications: Secondary | ICD-10-CM

## 2024-01-05 DIAGNOSIS — E1169 Type 2 diabetes mellitus with other specified complication: Secondary | ICD-10-CM

## 2024-01-05 DIAGNOSIS — Z125 Encounter for screening for malignant neoplasm of prostate: Secondary | ICD-10-CM

## 2024-01-05 DIAGNOSIS — I10 Essential (primary) hypertension: Secondary | ICD-10-CM

## 2024-01-05 NOTE — Telephone Encounter (Signed)
-----   Message from Veva JINNY Ferrari sent at 12/24/2023 11:12 AM EDT ----- Regarding: Lab orders for Tue, 10.14.25 Patient is scheduled for CPX labs, please order future labs, Thanks , Veva

## 2024-01-07 ENCOUNTER — Ambulatory Visit: Payer: Self-pay | Admitting: Family Medicine

## 2024-01-07 ENCOUNTER — Other Ambulatory Visit (INDEPENDENT_AMBULATORY_CARE_PROVIDER_SITE_OTHER)

## 2024-01-07 DIAGNOSIS — E119 Type 2 diabetes mellitus without complications: Secondary | ICD-10-CM | POA: Diagnosis not present

## 2024-01-07 DIAGNOSIS — Z125 Encounter for screening for malignant neoplasm of prostate: Secondary | ICD-10-CM | POA: Diagnosis not present

## 2024-01-07 DIAGNOSIS — E1169 Type 2 diabetes mellitus with other specified complication: Secondary | ICD-10-CM | POA: Diagnosis not present

## 2024-01-07 DIAGNOSIS — Z7984 Long term (current) use of oral hypoglycemic drugs: Secondary | ICD-10-CM

## 2024-01-07 DIAGNOSIS — I10 Essential (primary) hypertension: Secondary | ICD-10-CM | POA: Diagnosis not present

## 2024-01-07 DIAGNOSIS — E785 Hyperlipidemia, unspecified: Secondary | ICD-10-CM | POA: Diagnosis not present

## 2024-01-07 LAB — COMPREHENSIVE METABOLIC PANEL WITH GFR
ALT: 41 U/L (ref 0–53)
AST: 27 U/L (ref 0–37)
Albumin: 4.8 g/dL (ref 3.5–5.2)
Alkaline Phosphatase: 39 U/L (ref 39–117)
BUN: 16 mg/dL (ref 6–23)
CO2: 29 meq/L (ref 19–32)
Calcium: 9.2 mg/dL (ref 8.4–10.5)
Chloride: 104 meq/L (ref 96–112)
Creatinine, Ser: 1.17 mg/dL (ref 0.40–1.50)
GFR: 71.42 mL/min (ref >60.00)
Glucose, Bld: 164 mg/dL — ABNORMAL HIGH (ref 70–99)
Potassium: 4.4 meq/L (ref 3.5–5.1)
Sodium: 141 meq/L (ref 135–145)
Total Bilirubin: 0.9 mg/dL (ref 0.2–1.2)
Total Protein: 6.7 g/dL (ref 6.0–8.3)

## 2024-01-07 LAB — HEMOGLOBIN A1C: Hgb A1c MFr Bld: 6.7 % — ABNORMAL HIGH (ref 4.6–6.5)

## 2024-01-07 LAB — CBC WITH DIFFERENTIAL/PLATELET
Basophils Absolute: 0 K/uL (ref 0.0–0.1)
Basophils Relative: 0.7 % (ref 0.0–3.0)
Eosinophils Absolute: 0.1 K/uL (ref 0.0–0.7)
Eosinophils Relative: 2 % (ref 0.0–5.0)
HCT: 47 % (ref 39.0–52.0)
Hemoglobin: 15.9 g/dL (ref 13.0–17.0)
Lymphocytes Relative: 42.8 % (ref 12.0–46.0)
Lymphs Abs: 2 K/uL (ref 0.7–4.0)
MCHC: 33.9 g/dL (ref 30.0–36.0)
MCV: 84.6 fl (ref 78.0–100.0)
Monocytes Absolute: 0.3 K/uL (ref 0.1–1.0)
Monocytes Relative: 5.4 % (ref 3.0–12.0)
Neutro Abs: 2.3 K/uL (ref 1.4–7.7)
Neutrophils Relative %: 49.1 % (ref 43.0–77.0)
Platelets: 217 K/uL (ref 150.0–400.0)
RBC: 5.56 Mil/uL (ref 4.22–5.81)
RDW: 14 % (ref 11.5–15.5)
WBC: 4.7 K/uL (ref 4.0–10.5)

## 2024-01-07 LAB — LIPID PANEL
Cholesterol: 98 mg/dL (ref 0–200)
HDL: 43.7 mg/dL (ref >39.00)
LDL Cholesterol: 34 mg/dL (ref 0–99)
NonHDL: 53.97
Total CHOL/HDL Ratio: 2
Triglycerides: 100 mg/dL (ref 0.0–149.0)
VLDL: 20 mg/dL (ref 0.0–40.0)

## 2024-01-07 LAB — TSH: TSH: 1.93 u[IU]/mL (ref 0.35–5.50)

## 2024-01-07 LAB — PSA: PSA: 0.49 ng/mL (ref 0.10–4.00)

## 2024-01-09 ENCOUNTER — Other Ambulatory Visit: Payer: Self-pay | Admitting: Urology

## 2024-01-10 ENCOUNTER — Other Ambulatory Visit: Payer: Self-pay

## 2024-01-10 MED ORDER — TADALAFIL 20 MG PO TABS: 20.0000 mg | ORAL_TABLET | Freq: Every day | ORAL | 0 refills | Status: AC | PRN

## 2024-01-13 ENCOUNTER — Ambulatory Visit (INDEPENDENT_AMBULATORY_CARE_PROVIDER_SITE_OTHER): Admitting: Family Medicine

## 2024-01-13 ENCOUNTER — Encounter: Payer: Self-pay | Admitting: Family Medicine

## 2024-01-13 VITALS — BP 130/70 | HR 68 | Temp 98.6°F | Ht 68.25 in | Wt 206.5 lb

## 2024-01-13 DIAGNOSIS — E1169 Type 2 diabetes mellitus with other specified complication: Secondary | ICD-10-CM | POA: Diagnosis not present

## 2024-01-13 DIAGNOSIS — Z0000 Encounter for general adult medical examination without abnormal findings: Secondary | ICD-10-CM

## 2024-01-13 DIAGNOSIS — Z7984 Long term (current) use of oral hypoglycemic drugs: Secondary | ICD-10-CM

## 2024-01-13 DIAGNOSIS — Z125 Encounter for screening for malignant neoplasm of prostate: Secondary | ICD-10-CM

## 2024-01-13 DIAGNOSIS — Z23 Encounter for immunization: Secondary | ICD-10-CM | POA: Diagnosis not present

## 2024-01-13 DIAGNOSIS — I10 Essential (primary) hypertension: Secondary | ICD-10-CM

## 2024-01-13 DIAGNOSIS — E785 Hyperlipidemia, unspecified: Secondary | ICD-10-CM

## 2024-01-13 DIAGNOSIS — N183 Chronic kidney disease, stage 3 unspecified: Secondary | ICD-10-CM

## 2024-01-13 DIAGNOSIS — E119 Type 2 diabetes mellitus without complications: Secondary | ICD-10-CM | POA: Diagnosis not present

## 2024-01-13 MED ORDER — LOSARTAN POTASSIUM 50 MG PO TABS: 50.0000 mg | ORAL_TABLET | Freq: Every day | ORAL | 3 refills | Status: AC
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
## 2024-01-13 NOTE — Patient Instructions (Addendum)
 Consider adding vitamin D3 over the counter 1000 international units daily  For bone and overall healthy    Add some strength training to your routine, this is important for bone and brain health and can reduce your risk of falls and help your body use insulin properly and regulate weight  Light weights, exercise bands , and internet videos are a good way to start  Yoga (chair or regular), machines , floor exercises or a gym with machines are also good options     Flu shot today

## 2024-01-13 NOTE — Assessment & Plan Note (Signed)
 Lab Results  Component Value Date   PSA 0.49 01/07/2024   PSA 0.50 12/19/2022   PSA 0.43 11/16/2021    No voiding symptoms or nocturia  No fam history of prostate cancer

## 2024-01-13 NOTE — Assessment & Plan Note (Signed)
 bp in fair control at this time  BP Readings from Last 1 Encounters:  01/13/24 130/70   No changes needed Most recent labs reviewed  Disc lifstyle change with low sodium diet and exercise  Plan to continue losartan  50 mg daily   Also continue nephrology care

## 2024-01-13 NOTE — Assessment & Plan Note (Signed)
 Discussed how this problem influences overall health and the risks it imposes  Reviewed plan for weight loss with lower calorie diet (via better food choices (lower glycemic and portion control) along with exercise building up to or more than 30 minutes 5 days per week including some aerobic activity and strength training   Choosing a gym now  Encouraged to keep up the good work

## 2024-01-13 NOTE — Assessment & Plan Note (Signed)
 Lab Results  Component Value Date   HGBA1C 6.7 (H) 01/07/2024   HGBA1C 6.8 (A) 10/15/2023   HGBA1C 7.1 (A) 05/08/2023   Under care of nephrology  Metformin  xr 500 mg daily (may increase to bid depending on advised of his nephrologist) Sent for ophy report Normal foot exam  Follow up 6 mo

## 2024-01-13 NOTE — Assessment & Plan Note (Signed)
 GFR 71.42 Improved Continues nephrology care Blood pressure and glucose controlled  Good fluid intake

## 2024-01-13 NOTE — Assessment & Plan Note (Signed)
 Disc goals for lipids and reasons to control them Rev last labs with pt Rev low sat fat diet in detail   Very well controlled Crestor  40 mg daily  Zetia  10 mg daily  Improving diet

## 2024-01-13 NOTE — Assessment & Plan Note (Signed)
 Reviewed health habits including diet and exercise and skin cancer prevention Reviewed appropriate screening tests for age  Also reviewed health mt list, fam hx and immunization status , as well as social and family history   See HPI Labs reviewed and ordered Health Maintenance  Topic Date Due   Complete foot exam   Never done   HIV Screening  Never done   Hepatitis C Screening  Never done   Pneumococcal Vaccine for age over 28 (1 of 2 - PCV) Never done   Hepatitis B Vaccine (1 of 3 - 19+ 3-dose series) Never done   Eye exam for diabetics  06/21/2023   Flu Shot  10/25/2023   COVID-19 Vaccine (7 - 2025-26 season) 01/28/2026*   Hemoglobin A1C  07/07/2024   Yearly kidney health urinalysis for diabetes  10/14/2024   Yearly kidney function blood test for diabetes  01/06/2025   Colon Cancer Screening  01/25/2028   DTaP/Tdap/Td vaccine (2 - Tdap) 02/08/2028   Zoster (Shingles) Vaccine  Completed   HPV Vaccine  Aged Out   Meningitis B Vaccine  Aged Out   Breast Cancer Screening  Discontinued  *Topic was postponed. The date shown is not the original due date.    Flu shot today  Eye exam sent for  Psa normal / stable  Discussed fall prevention, supplements and exercise for bone density  PHQ 0

## 2024-01-27 ENCOUNTER — Encounter: Payer: Self-pay | Admitting: Radiology

## 2024-01-30 ENCOUNTER — Other Ambulatory Visit: Payer: Self-pay

## 2024-01-30 DIAGNOSIS — N529 Male erectile dysfunction, unspecified: Secondary | ICD-10-CM

## 2024-01-31 ENCOUNTER — Other Ambulatory Visit

## 2024-01-31 DIAGNOSIS — N529 Male erectile dysfunction, unspecified: Secondary | ICD-10-CM

## 2024-02-01 LAB — TESTOSTERONE: Testosterone: 208 ng/dL — ABNORMAL LOW (ref 264–916)

## 2024-02-02 ENCOUNTER — Ambulatory Visit: Payer: Self-pay | Admitting: Urology

## 2024-02-09 ENCOUNTER — Telehealth: Payer: Self-pay | Admitting: Urology

## 2024-02-09 NOTE — Telephone Encounter (Signed)
 Would you see if there is any way he could come Monday morning before 930 or Tuesday morning before 930 for his second testosterone  draw?
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
## 2024-02-10 ENCOUNTER — Other Ambulatory Visit: Payer: Self-pay

## 2024-02-10 DIAGNOSIS — N529 Male erectile dysfunction, unspecified: Secondary | ICD-10-CM

## 2024-02-11 ENCOUNTER — Encounter: Payer: Self-pay | Admitting: Urology

## 2024-02-11 ENCOUNTER — Other Ambulatory Visit

## 2024-02-11 ENCOUNTER — Ambulatory Visit: Admitting: Urology

## 2024-02-11 VITALS — BP 155/104 | HR 64 | Ht 69.0 in | Wt 207.0 lb

## 2024-02-11 DIAGNOSIS — N529 Male erectile dysfunction, unspecified: Secondary | ICD-10-CM

## 2024-02-11 DIAGNOSIS — E349 Endocrine disorder, unspecified: Secondary | ICD-10-CM | POA: Diagnosis not present

## 2024-02-12 ENCOUNTER — Ambulatory Visit: Payer: Self-pay | Admitting: Urology

## 2024-02-12 ENCOUNTER — Other Ambulatory Visit: Payer: Self-pay | Admitting: Urology

## 2024-02-12 DIAGNOSIS — E291 Testicular hypofunction: Secondary | ICD-10-CM

## 2024-02-12 LAB — TESTOSTERONE: Testosterone: 193 ng/dL — ABNORMAL LOW (ref 264–916)

## 2024-02-12 MED ORDER — TESTOSTERONE 20.25 MG/ACT (1.62%) TD GEL: 2.0000 | Freq: Every day | TRANSDERMAL | 3 refills | Status: DC

## 2024-02-12 NOTE — Telephone Encounter (Signed)
 Pt called back to confirm we got the message about his pharmacy, [facility] in [place] to send testosterone  gel to.

## 2024-03-13 ENCOUNTER — Other Ambulatory Visit

## 2024-03-13 DIAGNOSIS — E349 Endocrine disorder, unspecified: Secondary | ICD-10-CM

## 2024-03-14 LAB — TESTOSTERONE: Testosterone: 371 ng/dL (ref 264–916)

## 2024-03-15 ENCOUNTER — Ambulatory Visit: Payer: Self-pay | Admitting: Urology

## 2024-03-16 ENCOUNTER — Other Ambulatory Visit: Payer: Self-pay | Admitting: Urology

## 2024-03-16 DIAGNOSIS — E291 Testicular hypofunction: Secondary | ICD-10-CM

## 2024-03-16 MED ORDER — TESTOSTERONE 20.25 MG/ACT (1.62%) TD GEL: 3.0000 | Freq: Every day | TRANSDERMAL | 3 refills | Status: AC

## 2024-04-02 ENCOUNTER — Other Ambulatory Visit: Payer: Self-pay | Admitting: Family Medicine

## 2024-04-04 ENCOUNTER — Other Ambulatory Visit: Payer: Self-pay | Admitting: Family Medicine

## 2024-04-30 ENCOUNTER — Encounter: Payer: Self-pay | Admitting: *Deleted
